# Patient Record
Sex: Female | Born: 1945 | Race: Black or African American | Hispanic: No | Smoking: Never smoker
Health system: Southern US, Community
[De-identification: ages and names within clinical notes are randomized; demographics above are authoritative.]

## PROBLEM LIST (undated history)

## (undated) DIAGNOSIS — I10 Essential (primary) hypertension: Secondary | ICD-10-CM

## (undated) DIAGNOSIS — I739 Peripheral vascular disease, unspecified: Secondary | ICD-10-CM

## (undated) DIAGNOSIS — E119 Type 2 diabetes mellitus without complications: Secondary | ICD-10-CM

## (undated) DIAGNOSIS — E785 Hyperlipidemia, unspecified: Secondary | ICD-10-CM

## (undated) HISTORY — DX: Type 2 diabetes mellitus without complications: E11.9

## (undated) HISTORY — DX: Hyperlipidemia, unspecified: E78.5

## (undated) HISTORY — DX: Peripheral vascular disease, unspecified: I73.9

## (undated) HISTORY — DX: Essential (primary) hypertension: I10

---

## 1999-10-19 ENCOUNTER — Emergency Department (HOSPITAL_COMMUNITY): Admission: EM | Admit: 1999-10-19 | Discharge: 1999-10-19 | Payer: Self-pay | Admitting: Emergency Medicine

## 2003-03-22 ENCOUNTER — Emergency Department (HOSPITAL_COMMUNITY): Admission: EM | Admit: 2003-03-22 | Discharge: 2003-03-22 | Payer: Self-pay | Admitting: Emergency Medicine

## 2003-07-09 ENCOUNTER — Encounter: Admission: RE | Admit: 2003-07-09 | Discharge: 2003-07-09 | Payer: Self-pay | Admitting: Family Medicine

## 2003-08-19 ENCOUNTER — Other Ambulatory Visit: Admission: RE | Admit: 2003-08-19 | Discharge: 2003-08-19 | Payer: Self-pay | Admitting: Family Medicine

## 2010-08-13 HISTORY — PX: OTHER SURGICAL HISTORY: SHX169

## 2010-11-02 ENCOUNTER — Inpatient Hospital Stay (HOSPITAL_COMMUNITY)
Admission: EM | Admit: 2010-11-02 | Discharge: 2010-11-08 | DRG: 418 | Disposition: A | Discharge status: Home / Self Care | Payer: Medicare Other | Attending: General Surgery | Admitting: General Surgery

## 2010-11-02 DIAGNOSIS — K8064 Calculus of gallbladder and bile duct with chronic cholecystitis without obstruction: Principal | ICD-10-CM | POA: Diagnosis present

## 2010-11-02 DIAGNOSIS — K8060 Calculus of gallbladder and bile duct with cholecystitis, unspecified, without obstruction: Principal | ICD-10-CM | POA: Diagnosis present

## 2010-11-02 DIAGNOSIS — I10 Essential (primary) hypertension: Secondary | ICD-10-CM | POA: Diagnosis present

## 2010-11-02 DIAGNOSIS — N390 Urinary tract infection, site not specified: Secondary | ICD-10-CM | POA: Diagnosis present

## 2010-11-02 LAB — CBC
HCT: 36.7 % (ref 36.0–46.0)
Hemoglobin: 12.6 g/dL (ref 12.0–15.0)
MCH: 32.9 pg (ref 26.0–34.0)
MCHC: 34.3 g/dL (ref 30.0–36.0)
MCV: 95.8 fL (ref 78.0–100.0)
Platelets: 169 10*3/uL (ref 150–400)
RBC: 3.83 MIL/uL — ABNORMAL LOW (ref 3.87–5.11)
RDW: 12.4 % (ref 11.5–15.5)
WBC: 4 10*3/uL (ref 4.0–10.5)

## 2010-11-02 LAB — DIFFERENTIAL
Basophils Absolute: 0 10*3/uL (ref 0.0–0.1)
Basophils Relative: 0 % (ref 0–1)
Eosinophils Absolute: 0 10*3/uL (ref 0.0–0.7)
Eosinophils Relative: 1 % (ref 0–5)
Lymphocytes Relative: 25 % (ref 12–46)
Lymphs Abs: 1 10*3/uL (ref 0.7–4.0)
Monocytes Absolute: 0.4 10*3/uL (ref 0.1–1.0)
Monocytes Relative: 10 % (ref 3–12)
Neutro Abs: 2.6 10*3/uL (ref 1.7–7.7)
Neutrophils Relative %: 64 % (ref 43–77)

## 2010-11-03 ENCOUNTER — Emergency Department (HOSPITAL_COMMUNITY): Payer: Medicare Other

## 2010-11-03 ENCOUNTER — Other Ambulatory Visit: Payer: Self-pay | Admitting: General Surgery

## 2010-11-03 ENCOUNTER — Encounter (HOSPITAL_COMMUNITY): Payer: Self-pay

## 2010-11-03 LAB — URINALYSIS, ROUTINE W REFLEX MICROSCOPIC
Bilirubin Urine: NEGATIVE
Glucose, UA: NEGATIVE mg/dL
Nitrite: NEGATIVE
Protein, ur: NEGATIVE mg/dL
Specific Gravity, Urine: 1.028 (ref 1.005–1.030)
Urobilinogen, UA: 1 mg/dL (ref 0.0–1.0)
pH: 5.5 (ref 5.0–8.0)

## 2010-11-03 LAB — COMPREHENSIVE METABOLIC PANEL
ALT: 39 U/L — ABNORMAL HIGH (ref 0–35)
AST: 34 U/L (ref 0–37)
Albumin: 3.6 g/dL (ref 3.5–5.2)
Alkaline Phosphatase: 70 U/L (ref 39–117)
BUN: 8 mg/dL (ref 6–23)
CO2: 23 mEq/L (ref 19–32)
Calcium: 9.2 mg/dL (ref 8.4–10.5)
Chloride: 105 mEq/L (ref 96–112)
Creatinine, Ser: 0.73 mg/dL (ref 0.4–1.2)
GFR calc Af Amer: >60 mL/min (ref >60)
GFR calc non Af Amer: >60 mL/min (ref >60)
Glucose, Bld: 142 mg/dL — ABNORMAL HIGH (ref 70–99)
Potassium: 3.5 mEq/L (ref 3.5–5.1)
Sodium: 136 mEq/L (ref 135–145)
Total Bilirubin: 0.3 mg/dL (ref 0.3–1.2)
Total Protein: 7.7 g/dL (ref 6.0–8.3)

## 2010-11-03 LAB — URINE MICROSCOPIC-ADD ON

## 2010-11-03 LAB — PROTIME-INR
INR: 1.09 (ref 0.00–1.49)
Prothrombin Time: 14.3 seconds (ref 11.6–15.2)

## 2010-11-03 LAB — APTT: aPTT: 30 seconds (ref 24–37)

## 2010-11-03 LAB — LIPASE, BLOOD: Lipase: 33 U/L (ref 11–59)

## 2010-11-03 MED ORDER — IOHEXOL 300 MG/ML  SOLN
100.0000 mL | Freq: Once | INTRAMUSCULAR | Status: AC | PRN
  Administered 2010-11-03: 100 mL via INTRAVENOUS

## 2010-11-04 ENCOUNTER — Inpatient Hospital Stay (HOSPITAL_COMMUNITY): Payer: Medicare Other

## 2010-11-04 LAB — COMPREHENSIVE METABOLIC PANEL
ALT: 214 U/L — ABNORMAL HIGH (ref 0–35)
AST: 303 U/L — ABNORMAL HIGH (ref 0–37)
Albumin: 3.1 g/dL — ABNORMAL LOW (ref 3.5–5.2)
Alkaline Phosphatase: 79 U/L (ref 39–117)
BUN: 5 mg/dL — ABNORMAL LOW (ref 6–23)
CO2: 25 mEq/L (ref 19–32)
Calcium: 8.9 mg/dL (ref 8.4–10.5)
Chloride: 105 mEq/L (ref 96–112)
Creatinine, Ser: 0.68 mg/dL (ref 0.4–1.2)
GFR calc Af Amer: >60 mL/min (ref >60)
GFR calc non Af Amer: >60 mL/min (ref >60)
Glucose, Bld: 144 mg/dL — ABNORMAL HIGH (ref 70–99)
Potassium: 4.3 mEq/L (ref 3.5–5.1)
Sodium: 137 mEq/L (ref 135–145)
Total Bilirubin: 0.5 mg/dL (ref 0.3–1.2)
Total Protein: 6.8 g/dL (ref 6.0–8.3)

## 2010-11-04 LAB — URINE CULTURE
Colony Count: 15000
Culture  Setup Time: 201203230430

## 2010-11-05 LAB — COMPREHENSIVE METABOLIC PANEL
ALT: 167 U/L — ABNORMAL HIGH (ref 0–35)
AST: 109 U/L — ABNORMAL HIGH (ref 0–37)
Albumin: 3 g/dL — ABNORMAL LOW (ref 3.5–5.2)
Alkaline Phosphatase: 77 U/L (ref 39–117)
BUN: 3 mg/dL — ABNORMAL LOW (ref 6–23)
CO2: 25 mEq/L (ref 19–32)
Calcium: 8.4 mg/dL (ref 8.4–10.5)
Chloride: 107 mEq/L (ref 96–112)
Creatinine, Ser: 0.58 mg/dL (ref 0.4–1.2)
GFR calc Af Amer: >60 mL/min (ref >60)
GFR calc non Af Amer: >60 mL/min (ref >60)
Glucose, Bld: 115 mg/dL — ABNORMAL HIGH (ref 70–99)
Potassium: 3.9 mEq/L (ref 3.5–5.1)
Sodium: 136 mEq/L (ref 135–145)
Total Bilirubin: 0.6 mg/dL (ref 0.3–1.2)
Total Protein: 6.6 g/dL (ref 6.0–8.3)

## 2010-11-05 LAB — CBC
HCT: 34.2 % — ABNORMAL LOW (ref 36.0–46.0)
Hemoglobin: 11 g/dL — ABNORMAL LOW (ref 12.0–15.0)
MCH: 31.6 pg (ref 26.0–34.0)
MCHC: 32.2 g/dL (ref 30.0–36.0)
MCV: 98.3 fL (ref 78.0–100.0)
Platelets: 163 10*3/uL (ref 150–400)
RBC: 3.48 MIL/uL — ABNORMAL LOW (ref 3.87–5.11)
RDW: 12.9 % (ref 11.5–15.5)
WBC: 7.3 10*3/uL (ref 4.0–10.5)

## 2010-11-06 LAB — COMPREHENSIVE METABOLIC PANEL
ALT: 231 U/L — ABNORMAL HIGH (ref 0–35)
AST: 183 U/L — ABNORMAL HIGH (ref 0–37)
Albumin: 3 g/dL — ABNORMAL LOW (ref 3.5–5.2)
Alkaline Phosphatase: 106 U/L (ref 39–117)
BUN: 5 mg/dL — ABNORMAL LOW (ref 6–23)
CO2: 27 mEq/L (ref 19–32)
Calcium: 9.1 mg/dL (ref 8.4–10.5)
Chloride: 104 mEq/L (ref 96–112)
Creatinine, Ser: 0.66 mg/dL (ref 0.4–1.2)
GFR calc Af Amer: >60 mL/min (ref >60)
GFR calc non Af Amer: >60 mL/min (ref >60)
Glucose, Bld: 99 mg/dL (ref 70–99)
Potassium: 3.8 mEq/L (ref 3.5–5.1)
Sodium: 137 mEq/L (ref 135–145)
Total Bilirubin: 0.5 mg/dL (ref 0.3–1.2)
Total Protein: 6.5 g/dL (ref 6.0–8.3)

## 2010-11-06 LAB — TYPE AND SCREEN
ABO/RH(D): B POS
Antibody Screen: NEGATIVE

## 2010-11-06 LAB — ABO/RH: ABO/RH(D): B POS

## 2010-11-06 NOTE — Consult Note (Signed)
[name], [name]                ACCOUNT NO.:  [id number]  MEDICAL RECORD NO.:  [id number]           PATIENT TYPE:  I  LOCATION:  1525                         FACILITY:  [facility]  PHYSICIAN:  [name]., M.D.DATE OF BIRTH:  [birth year]  DATE OF CONSULTATION:  11/03/2010 DATE OF DISCHARGE:                                CONSULTATION   REFERRING PHYSICIAN:  Amber L. Freida Busman, MD  PRIMARY CARE PHYSICIAN:  None.  REASON FOR CONSULTATION:  Questionable abnormal intraoperative cholangiogram  HISTORY:  The patient is a 65 year old African American woman who came into the ER complaining of back pain.  She is quite healthy.  This has been going on for several days.  Abdominal ultrasound in the emergency room revealed a gallbladder packed with stones and normal common bile duct.  A CT scan showed some cardiomegaly and a small cyst.  Her labs are remarkable for a normal white count and completely normal liver function tests.  The patient went for a laparoscopic cholecystectomy today.  She had an intraoperative cholangiogram done with a gallbladder in place.  There was a question of a defect in the distal common bile duct.  The patient is postop and still having a little pain and discomfort but nothing major and sipping on clear liquids.  MEDICATIONS ON ADMISSION:  Over-the-counter medicines such as Tylenol. No other medications other than Goody's and  naproxen p.r.n.  ALLERGIES:  She is allergic to PENICILLIN.  PAST MEDICAL HISTORY:  She has had eye surgery and has had a previous history of hypertension, has not been on any therapy recent.  Only surgery has been the cataracts.  FAMILY HISTORY:  Mother is living, has dementia and hypertension and history of coronary artery disease.  Her father's health is unknown. She had a sister who died from heart failure.  SOCIAL HISTORY:  She is a heavy drinker on weekends.  She is a retired Advertising copywriter at [employer]  PHYSICAL  EXAMINATION:  VITAL SIGNS:  The patient is afebrile.  Vital signs unremarkable. GENERAL:  A talkative African American female, in no acute distress. EYES:  Sclerae nonicteric. LUNGS:  Clear. HEART:  Regular rate and rhythm without murmurs or gallops. ABDOMEN:  Distended and slightly tender in the upper abdomen with a few bowel sounds.  ASSESSMENT:  Abnormal intraoperative cholangiogram with a possibility of stone or debris in the distal bile duct.  The patient is asymptomatic. Her liver tests are normal.  There is no need for an urgent endoscopic retrograde cholangiopancreatography.  PLAN:  We will go ahead and follow her with you and follow her liver function tests.  She has recovered adequately from the surgery. We will likely do an MRCP or possibly an ERCP if her liver function tests bump up.  We have discussed this approach with the patient and her son.          ______________________________ [name]. Malon [name]., M.D.     Waldron Session  D:  11/03/2010  T:  11/04/2010  Job:  [id number]  cc:   Tammy Muckle, MD [address]   [address] [place]  [place] [id number]  Electronically Signed by [name] M.D. on 11/06/2010 07:16:02 AM

## 2010-11-07 ENCOUNTER — Inpatient Hospital Stay (HOSPITAL_COMMUNITY): Payer: Medicare Other

## 2010-11-08 LAB — COMPREHENSIVE METABOLIC PANEL
ALT: 158 U/L — ABNORMAL HIGH (ref 0–35)
AST: 81 U/L — ABNORMAL HIGH (ref 0–37)
Albumin: 3.1 g/dL — ABNORMAL LOW (ref 3.5–5.2)
Alkaline Phosphatase: 95 U/L (ref 39–117)
BUN: 7 mg/dL (ref 6–23)
CO2: 26 mEq/L (ref 19–32)
Calcium: 8.6 mg/dL (ref 8.4–10.5)
Chloride: 104 mEq/L (ref 96–112)
Creatinine, Ser: 0.74 mg/dL (ref 0.4–1.2)
GFR calc Af Amer: >60 mL/min (ref >60)
GFR calc non Af Amer: >60 mL/min (ref >60)
Glucose, Bld: 91 mg/dL (ref 70–99)
Potassium: 3.5 mEq/L (ref 3.5–5.1)
Sodium: 136 mEq/L (ref 135–145)
Total Bilirubin: 0.8 mg/dL (ref 0.3–1.2)
Total Protein: 6.5 g/dL (ref 6.0–8.3)

## 2010-11-08 LAB — CBC
HCT: 34.2 % — ABNORMAL LOW (ref 36.0–46.0)
Hemoglobin: 11 g/dL — ABNORMAL LOW (ref 12.0–15.0)
MCH: 31.3 pg (ref 26.0–34.0)
MCHC: 32.2 g/dL (ref 30.0–36.0)
MCV: 97.4 fL (ref 78.0–100.0)
Platelets: 178 10*3/uL (ref 150–400)
RBC: 3.51 MIL/uL — ABNORMAL LOW (ref 3.87–5.11)
RDW: 12.6 % (ref 11.5–15.5)
WBC: 5 10*3/uL (ref 4.0–10.5)

## 2010-11-14 NOTE — Op Note (Signed)
  NAMECADYNCE, [name]                ACCOUNT NO.:  [id number]  MEDICAL RECORD NO.:  [id number]           PATIENT TYPE:  I  LOCATION:  1525                         FACILITY:  [facility]  PHYSICIAN:  Graylin Shiver, M.D.   DATE OF BIRTH:  September 18, 1945  DATE OF PROCEDURE:  11/04/2010 DATE OF DISCHARGE:                              OPERATIVE REPORT   PROCEDURE:  Attempted endoscopic retrograde cholangiopancreatography.  INDICATIONS FOR PROCEDURE:  The patient is a 65 year old female who underwent laparoscopic cholecystectomy yesterday with findings on intraoperative cholangiogram of probable of filling defects compatible with stones or sludge in the common bile duct.  No contrast passed into the duodenum.  Today, the patient's liver enzymes were higher than they had been and it was felt therefore that ERCP with sphincterotomy and stone extraction was indicated.  The patient was not having any abdominal pain and was not toxic in anyway.  The procedure was explained along with the potential risks of bleeding, infection, perforation, and pancreatitis.  DESCRIPTION OF PROCEDURE:  With the patient lying on her abdomen on the fluoroscopy table, she was given presedation with fentanyl 125 mcg IV, Versed 12 mg IV, and Benadryl 25 mg IV.  These were the total dosages given during the procedure.  The lateral viewing duodenum scope was inserted into the oropharynx and passed into the esophagus.  It was advanced down the esophagus and then into the stomach.  The stomach had a diffuse erythematous appearance to the mucosa compatible with gastritis.  The duodenum was entered and no abnormalities were seen. The papilla of Vater was located and it did appear small.  I attempted to cannulate the papilla multiple times using a guidewire and papillotome but was unable to successfully catheterize the common bile duct.  There was one time where the guidewire passed into the pancreatic duct but then I was unable  to get the guidewire into either duct again. The guidewire and catheter kept either bouncing off the papilla or would just not advance into either duct.  Multiple attempts were tried and finally I felt that I was not going to achieve cannulation.  The scope was withdrawn.  She tolerated the procedure well without obvious complications.  IMPRESSION:  Unsuccessful cannulation of the common bile duct.  PLAN:  I will give her a few days to allow the swelling to go down in the region of the papilla and then ask one of my partners to reattempt ERCP.          ______________________________ Graylin Shiver, M.D.    SFG/MEDQ  D:  11/04/2010  T:  11/05/2010  Job:  [id number]  cc:   [facility]  Electronically Signed by Herbert Moors MD on 11/14/2010 04:38:35 PM

## 2010-11-22 NOTE — Op Note (Signed)
[name], [name]                ACCOUNT NO.:  [id number]  MEDICAL RECORD NO.:  [id number]           PATIENT TYPE:  E  LOCATION:  WLED                         FACILITY:  [facility]  PHYSICIAN:  Lennie Muckle, MD      DATE OF BIRTH:  [birth year]  DATE OF PROCEDURE:  11/03/2010 DATE OF DISCHARGE:                              OPERATIVE REPORT   PREOPERATIVE DIAGNOSIS:  Acute cholecystitis.  POSTOPERATIVE DIAGNOSES:  Cholecystitis, choledocholithiasis.  PROCEDURE:  Laparoscopic cholecystectomy with cholangiogram.  SURGEON:  Jasan Doughtie L. Freida Busman, MD  ASSISTANT:  OR staff.  FINDINGS:  Acute findings of cholecystitis, multiple stones within the gallbladder, as well as multiple stones within the common bile duct.  SPECIMEN:  Gallbladder.  ESTIMATED BLOOD LOSS:  Minimal.  ANESTHESIA:  General.  INDICATION FOR PROCEDURE:  Ms. [name] is a 65 year old female who came in with abdominal and back pain, chronic vague abdominal pain.  Workup included a CBC, which was normal.  Serum chemistry showed small elevation of ALT, serum bilirubin was normal.  Imaging, there was a thickened gallbladder wall pericholecystic fluid.  She continued to have some discomfort in her back.  Due to the findings of pericholecystic fluid, I felt she has likely acute cholecystitis.  There was quite rise in her liver enzymes, therefore, we discusses the need for a cholangiogram.  Informed consent was obtained.  PROCEDURE IN DETAIL:  The patient was taken to the ER in the preoperative holding area, seen by Anesthesia, and given Cipro perioperatively.  She was taken to the operating room, placed in supine position.  After administration of general endotracheal anesthesia, sequential compression devices were applied to her lower extremities. Then, she was intubated.  There was noticed a mass on the posterior arytenoid.  We were able to get the endotracheal tube into the airway successfully.  Her abdomen was then prepped  and draped.  Surgical time- out was performed and again was placed an incision in the right upper quadrant.  Using a 5 mm camera, I placed the trocar into the abdominal cavity.  All layers of abdominal wall was visualized upon entry.  I inspected the abdomen and found no evidence of injury upon placement of the trocar after insufflation.  I then placed an 11 mm trocar at the umbilical region.  The patient was placed in the reverse Trendelenburg position.  Two additional trocars were placed in the right upper quadrant and visualization with  camera.  I grasped the fundus of the gallbladder, it was  somewhat distended and was irrigated and suctioned some of the fluid.  I was able to decompress the gallbladder enough to grasp the fundus of the gallbladder.  I then placed an additional port in the abdomen to lift up the left lobe of the liver due to its large size.  I then dissected down near the infundibulum.  There was some pericholecystic fluid and thickened fluid within the gallbladder wall. Using mostly blunt dissection as well as hook electrocautery, I obtained the  critical view of cystic duct and artery.  I clipped the cystic artery.  The duct was rather large in  size.  I dissected this down towards the common duct as much as I could close to the common bile duct.  I placed a clip proximally on the cystic duct and partially transected it.  I did feel several stones within the cystic duct.  I was able to milk these out of the cystic duct and I tried to get as close to common duct as I could to lift stones out.  I placed the cholangiogram catheter within the cystic duct, I secured this with a clip.  The cholangiogram was performed.  Flow went right and left hepatic duct in distally.  There was shadowing within the common duct.  I tried went into the duodenum; however, several stones were noted within the common bile duct.  I was rather close when she had a short cystic duct.  I  felt also this time short cystic duct at my dissection alone.  I placed 2 clips proximally on the cystic duct, transected and also placed an Endoloop around the cystic duct.  Then I continued dividing the gallbladder from the liver using electrocautery.  I removed the gallbladder in the EndoCatch bag.  I irrigated the abdomen with 2 L of saline.  There was a small amount of oozing from the liver bed, which I was able to control with electrocautery.  I placed a layer of __________ within the gallbladder fossa.  I did 1 final inspection of the liver bed, found no bleeding, no evidence of bile leakage.  I closed the fascial defect with 0 Vicryl, several sutures for full closure.  Her tissue was very thin, however, I was able to close the fascia successfully.  I contact Dr. Ramon Dredge for possible ERCP tomorrow.  We will repeat her serum chemistry tomorrow.     Lennie Muckle, MD     ALA/MEDQ  D:  11/03/2010  T:  11/04/2010  Job:  [id number]  Electronically Signed by Bertram Savin MD on 11/22/2010 12:06:36 PM

## 2010-11-22 NOTE — H&P (Signed)
Tammy, [name]                ACCOUNT NO.:  [id number]  MEDICAL RECORD NO.:  [id number]           PATIENT TYPE:  E  LOCATION:  WLED                         [facility]ACILITY:  [facility]  PHYSICIAN:  Lennie Muckle, MD      DATE OF BIRTH:  07-23-1946  DATE OF ADMISSION:  11/02/2010 DATE OF DISCHARGE:                             HISTORY & PHYSICAL   CHIEF COMPLAINT:  Back and flank pain going to the lower abdomen.  BRIEF HISTORY:  The patient is a 65 year old black female who has pain in her right and left sides.  She reports it hurts standing, lying down, and sitting, usually with movement.  This has been going on since 2006. She woke up yesterday and had pain, took her usual Tylenol and continued to do her daily chores.  Last night around 7:00 p.m. after supper, she developed nausea and vomiting 1 episode and describes a sharper pain than usual which lasted for about 5 hours before she came to the ER.  On arrival in the ER, her blood pressure was 165/105, her heart rate was 84, temperature was 97.8, and sats were 96% on room air.  She was treated with some morphine and has had no significant problems since. She underwent a CT of the abdomen which shows multiple gallstones, pericholecystic fluid, and gallbladder wall thickening slightly.  There is mild biliary ductal dilatation.  Common bile duct was normal.  There is a left renal cyst, otherwise the study was normal.  She also had an abdominal ultrasound which showed multiple stones.  Gallbladder wall was 0.4 cm with pericholecystic fluid and a questionable Murphy sign.  We were asked to see the patient in consultation after this.  The patient denies any problems with bowel movements.  Her last p.o. intake was last p.m. prior to coming to the ER.  PAST MEDICAL HISTORY:  Shows a history of high blood pressure but she has been on no medicines for at least 5 years.  PAST SURGICAL HISTORY:  She has had bilateral cataracts, otherwise  none.  FAMILY HISTORY:  Her mother is living.  She has dementia, high blood pressure, peptic ulcer disease, history of MI.  Her mother takes a good deal care, and she is frequently moving her mother, lifting and carrying her.  Father, unknown.  Brothers, none.  One sister who is deceased with "a hole in her heart."  SOCIAL HISTORY:  Tobacco none.  Alcohol, she is a fairly heavy drinker on the weekends and says she goes through a half a gallon but she is also drinking with friends.  Drugs, none.  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  REVIEW OF SYSTEMS:  CONSTITUTIONAL:  Fever, none.  SKIN:  No changes in weight.  She is unsure but she thinks she may have gained some weight. PSYCH:  No significant problems.  CV:  No headaches, dizziness, or syncope.  No history of stroke or seizure.  PULMONARY:  No orthopnea, no PND, no dyspnea on exertion.  No coughing or wheezing.  No asthma or recent upper respiratory tract infection.  CARDIAC:  No history  of chest pain on cardiac evaluation.  GI:  Negative for GERD.  She only had 1episode of nausea and vomiting.  No diarrhea, constipation, or blood in her stool.  GU:  No trouble voiding.  No odor or discomfort she is aware of.  LOWER EXTREMITIES:  No edema.  No claudication.  MUSCULOSKELETAL: No joint pain, just generalized aches on her side.  ENDOCRINE:  No thyroid disease or diabetes.  MEDICATIONS: 1. Tylenol p.r.n. 2. Naproxen p.r.n.  ALLERGIES:  PENICILLIN.  PHYSICAL EXAMINATION:  GENERAL:  This is a well-nourished, well- developed African American female in no acute distress. VITAL SIGNS:  Her last blood pressure was 164/98, heart rate was 68, temperature was 98, respiratory rate is 16, and sats are 100% on room air. HEAD:  Normocephalic. EYES, EARS, NOSE, AND THROAT:  All within normal limits. NECK:  No bruits.  No JVD.  No thyromegaly.  Trachea is in the midline. PULMONARY:  Normal respiratory effort. LUNGS:  Clear to  auscultation. CHEST:  Nontender to palpation. CARDIAC:  Normal S1-S2.  No murmur or rub was heard. ABDOMEN:  Bowel sounds are present.  Abdomen is not really distended. She is slightly tender on both flanks.  I cannot tell an appreciable difference between the right and the left.  There is no hernia, masses, or abscesses noted. RECTAL AND GENITALIA:  Deferred. LYMPHADENOPATHY:  None palpated in the cervical, axillary, or femoral areas. MUSCULOSKELETAL:  Within normal limits. SKIN:  No changes. NEUROLOGIC:  Cranial nerves are intact.  No focal changes. PSYCH:  Normal affect.  LABORATORY DATA:  Lipase 33.  Sodium is 136, potassium is 3.5, chloride is 105, CO2 is 23, BUN is 8, creatinine is 0.73, glucose 172, total bilirubin 0.3, alk phos 70, SGOT is 34, and SGPT is 39.  UA shows 11-20 white cells per high-powered field and lot of bacteria.  White count is 4000, hemoglobin is 12.6, hematocrit 36.7, and platelets 169,000.  IMPRESSION: 1. Chronic versus acute cholecystitis and cholelithiasis. 2. Probable urinary tract infection. 3. Hypertension. 4. Alcohol use.  PLAN:  We will keep her n.p.o. until we plan for laparoscopic cholecystectomy.  This was discussed with the patient and her family in detail and she is in agreement.  We will start her on antibiotics and begin treatment for high blood pressure.  She has no primary care physician and has been given a list by the ER of people to follow up with.     Eber Hong, P.A.   ______________________________ Lennie Muckle, MD    WDJ/MEDQ  D:  11/03/2010  T:  11/03/2010  Job:  [id number]  Electronically Signed by Sherrie George P.A. on 11/11/2010 11:17:06 AM Electronically Signed by Bertram Savin MD on 11/22/2010 12:05:59 PM

## 2010-11-22 NOTE — Discharge Summary (Signed)
NAMESAMIYAH, STUPKA                ACCOUNT NO.:  [id number]  MEDICAL RECORD NO.:  [id number]           PATIENT TYPE:  I  LOCATION:  1525                         FACILITY:  [facility]  PHYSICIAN:  Lennie Muckle, MD      DATE OF BIRTH:  08-Sep-1945  DATE OF ADMISSION:  11/02/2010 DATE OF DISCHARGE:  11/08/2010                              DISCHARGE SUMMARY   ADMISSION DIAGNOSES: 1. Cholelithiasis, cholecystitis with abdominal pain. 2. Questionable urinary tract infection. 3. Hypertension. 4. History of alcohol use.  DISCHARGE DIAGNOSES: 1. Cholecystitis, cholelithiasis. 2. Questionable urinary tract infection showed only 15,000 colonies. 3. Hypertension. 4. Alcohol use.  PROCEDURES: 1. Laparoscopic cholecystectomy, cholangiogram November 03, 2010, Dr.     Freida Busman. 2. Attempted endoscopic retrograde cholangiopancreatography November 04, 2010, Dr. Evette Cristal. 3. Endoscopic retrograde cholangiopancreatography, sphincterotomy,     stone extraction November 07, 2010, Dr. Ewing Schlein.  BRIEF HISTORY:  The patient is a 65 year old female who has had pain in her right and left side.  She reports it hurts standing, lying down, usually sitting and with movement.  It has been going on since 2006. She woke up the day prior to admission and had pain.  Took Tylenol and continued to do her daily chores.  Last night at around 7:00 p.m. she felt nausea and vomiting.  Pain was sharper and lasted for about 5 hours.  She was seen in the ER where she was extremely hypertensive. Blood pressure 165/105.  Her heart rate was 84.  Temperature was 97. She was treated with morphine and had no significant problems since.  CT of the abdomen shows multiple gallstones, pericholecystic fluid and gallbladder wall thickening.  There is also mild biliary ductal dilatation.  The common bile duct was normal.  There was a left renal cyst.  Otherwise normal.  Gallbladder wall was 0.4 cm on ultrasound again with pericholecystic fluid  and a questionable of Murphy's sign. She was seen in the ER and subsequently admitted by Dr. Freida Busman.  PAST MEDICAL HISTORY:  Shows a history of high blood pressure.  She has been on no medicines for at least 5 years.  PAST SURGICAL HISTORY:  Bilateral cataracts.  FAMILY HISTORY:  Her mother is living, has dementia and the patient takes care of her mother a good deal of the time, which includes frequently lifting and carrying her.  SOCIAL HISTORY:  Tobacco none.  Alcohol:  Fairly heavy drinker on weekends and says she goes through a half gallon but also drinks with friends.  Drugs none.  MEDICATIONS ON ADMISSION:  Tylenol and Naproxen.  ALLERGIES:  PENICILLIN.  HOSPITAL COURSE:  The patient was admitted.  She was placed on antibiotics and taken to the OR the next day at which time she underwent laparoscopic cholecystectomy with cholangiogram.  There were multiple stones within the gallbladder as well as multiple stones within the common bile duct.  She tolerated the procedure well and returned to the floor in satisfactory condition.  She was seen after that by Dr. Carman Ching and set up for ERCP the next day by Dr. Evette Cristal.  Unfortunately,  he was not able to cannulate the common bile duct.  The patient was sent back to the floor.  She was rested.  She was stable over the weekend and then taken back to the endoscopy suite where she underwent ERCP, sphincterotomy and stone extraction successfully on November 07, 2010, by Dr. Ewing Schlein.  Currently the patient is doing well.  She is afebrile.  Her blood pressure is still elevated.  She was empirically placed on Norvasc in the hospital.  Despite that, her blood pressure is still somewhat elevated, 127/86 and 134/90 are the last two blood pressures. Laboratories shows a white count of 5000, hemoglobin is 11, hematocrit is 34, platelets 178,000.  Sodium is 136, potassium is 3.5, chloride is 104, CO2 is 26, BUN is 7, creatinine 0.74, glucose is  91.  Her LFTs today shows a total bilirubin 0.8, alkaline phosphatase of 95, SGOT of 81, SGPT of 158.  On March 26 total bilirubin 0.5, alkaline phosphatase 106, SGOT 183, SGPT 231.  On March 24 total bilirubin and alkaline phosphatase were normal.  SGOT was 303.  SGPT was 214.  She is tolerating a regular diet without any difficulty.  Her wounds looked good and at this point we anticipate discharge today.  FOLLOWUP:  We have her set up to come back to the [facility] on November 28, 2010, at 2:00 p.m.  We are going to have her see Dr. Parke Simmers in less than 1 month for followup for her blood pressure.  We will await GI's followup to see when they want to see her back.  DISCHARGE ACTIVITY:  Light to moderate as instructed.  No lifting over 10 pounds.  No driving.  No strenuous activity.  DISCHARGE MEDICATIONS:  I am going to put her on: 1. Lisinopril 5 mg daily for one month with no refills.  Follow up per     Dr. Parke Simmers. 2. Multivitamin one daily. 3. Percocet 5/325 one to two q.4 p.r.n. 4. She can take Aleve with preadmission medicines one q.8 p.r.n. 5. She was also on Goody's Powders and Tylenol, which she may take on     a p.r.n. basis.  CONDITION ON DISCHARGE:  Improved.     Eber Hong, P.A.   ______________________________ Lennie Muckle, MD    WDJ/MEDQ  D:  11/08/2010  T:  11/08/2010  Job:  [id number]  cc:   Eber Hong, P.A.  Renaye Rakers, M.D. Fax: [phone]  Llana Aliment. Malon Kindle., M.D. Fax: [phone]  Electronically Signed by Sherrie George P.A. on 11/11/2010 11:19:04 AM Electronically Signed by Bertram Savin MD on 11/22/2010 12:05:56 PM

## 2010-11-27 NOTE — Op Note (Signed)
[name], [name]                ACCOUNT NO.:  [id number]  MEDICAL RECORD NO.:  [id number]           PATIENT TYPE:  I  LOCATION:  1525                         FACILITY:  [facility]  PHYSICIAN:  Petra Kuba, M.D.    DATE OF BIRTH:  1945/12/19  DATE OF PROCEDURE:  11/07/2010 DATE OF DISCHARGE:                              OPERATIVE REPORT   PROCEDURE:  ERCP, sphincterotomy, and stone extraction.  INDICATIONS:  The patient with a positive intraoperative cholangiogram, worrisome for stones and no drainage into the duodenum as well as with increased liver tests.  Consent was signed after risks, benefits, methods, options thoroughly discussed by both my partner, Dr. Evette Cristal, and me prior to sedation.  MEDICINES USED:  General anesthesia per anesthesia team.  PROCEDURE:  Side-viewing therapeutic video duodenoscope was inserted by indirect vision into the stomach and advanced through normal antrum of pylorus into the duodenum and a normal-appearing ampulla was brought into view.  Unfortunately, using the smaller sphincterotome loaded with the smaller Jagwire, we were unable to cannulate.  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  There were no injections or wire advancements towards either ducts.  After a moderate effort, we elected to proceed with a needle knife sphincterotomy because she did have a fairly long intraduodenal segment and this was done in the customary fashion until we saw bile oozing out of the duct.  We tried to advance the wire through the needle knife, but were unsuccessful and switched back to the sphincterotome.  Unfortunately, we could not cannulate with the sphincterotome in this method until we increased the needle knife sphincterotomy 3 times.  The amount of bile that was oozing out of the duct increase with each needle knife cut and then we were able to cannulate with the wire in the sphincterotome. Once that was done, dye was  injected and the ducts were filled.  There was no obvious duct abnormality, although based on the previous sphincterotomy dye readily emptied from the distal part of the duct.  We went ahead and increase the sphincterotomy using the sphincterotome in the customary fashion and then proceeded with multiple balloon pull- throughs using the 12-15 adjustable balloon, inflated to roughly the 12- 13 mm marked.  One small stone was delivered, but no other stones, sludge, or debris.  We then proceeded with an occlusion cholangiogram and we were using the inject below the balloon, so there were no obvious residual stones seen and no obvious leak from the sphincterotomy.  The balloon pulled through multiple times fairly readily through the patent sphincterotomy site.  After one more balloon pull-through, we elected to stop the procedure at this juncture.  The drainage at this time was a little sluggish, but adequate.  We elected to stop the procedure.  The wire was removed.  Scope was removed.  The patient tolerated the procedure well.  There was no obvious immediate complications.  ENDOSCOPIC DIAGNOSES: 1. Normal mobile ampulla. 2. No PD injections throughout the procedure. 3. Difficult cannulation, requiring needle knife sphincterotomy x3 and     then cannulation with the  sphincterotome and the wire and then     increasing the sphincterotomy in the customary fashion. 4. Multiple 12-15 mm adjustable balloon pull-throughs with 1 small     stone being delivered. 5. Negative occlusion cholangiogram at the end of the procedure and     sluggish, but adequate biliary drainage.  PLAN:  We will observe for delayed complications, if none hopefully can advance her diet tomorrow and home soon.          ______________________________ Petra Kuba, M.D.     MEM/MEDQ  D:  11/07/2010  T:  11/08/2010  Job:  [id number]  cc:   Graylin Shiver, M.D. Fax: [phone]  Lorne Skeens. Hoxworth, M.D. [address] [place] [address]  Electronically Signed by Vida Rigger M.D. on 11/27/2010 12:46:30 PM

## 2011-01-18 ENCOUNTER — Encounter (INDEPENDENT_AMBULATORY_CARE_PROVIDER_SITE_OTHER): Payer: Self-pay | Admitting: General Surgery

## 2012-12-17 IMAGING — CT CT ABD-PELV W/ CM
2 of 5 series · 17 of 46 positions shown, 19 images · IV contrast (agent unspecified)
Comparison: None.

CLINICAL DATA: Abdominal and back pain.  Flank pain.

CT ABDOMEN AND PELVIS WITH CONTRAST
TECHNIQUE: Multidetector CT imaging of the abdomen and pelvis was
performed following the standard protocol during bolus
administration of intravenous contrast.
Contrast: 100 ml 6mnipaque-3LL.

[Series 2: rtn ap with st · axial · 0.68mm/px · z∈[+848,+1218]mm · 14 of 84 slices shown, 16 images]
[im 5/84  soft-tissue]
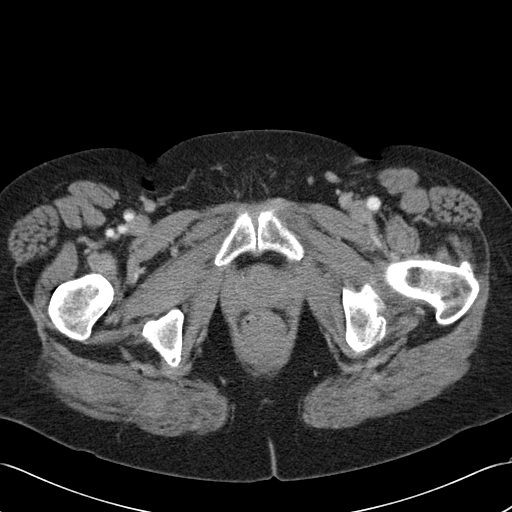
[im 5/84  bone]
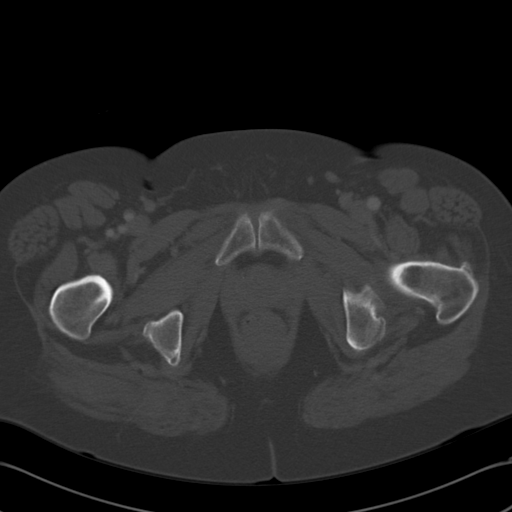
[im 13/84  soft-tissue]
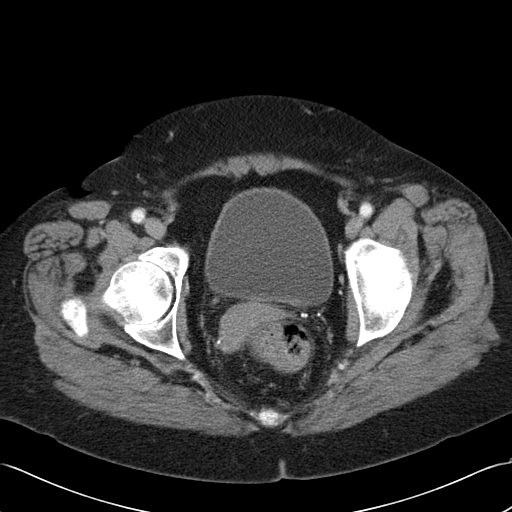
[im 17/84  soft-tissue]
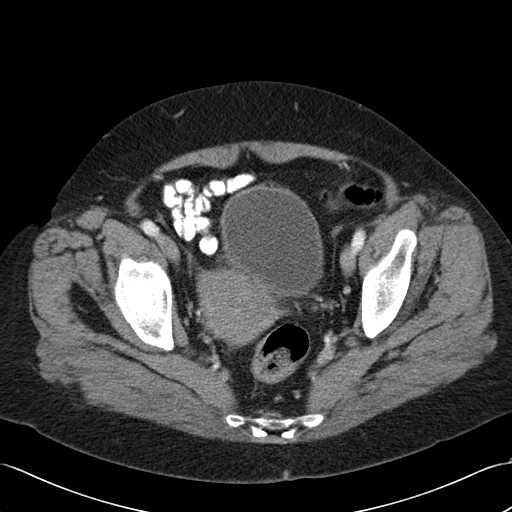
[im 21/84  soft-tissue]
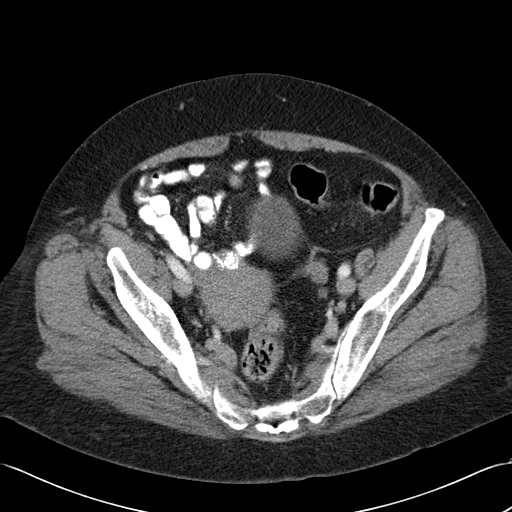
[im 30/84  soft-tissue]
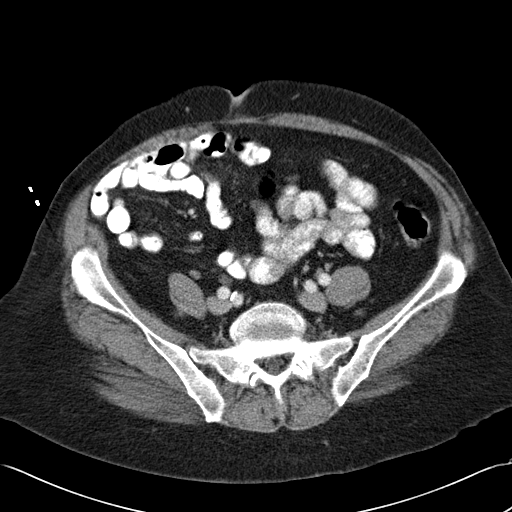
[im 34/84  soft-tissue]
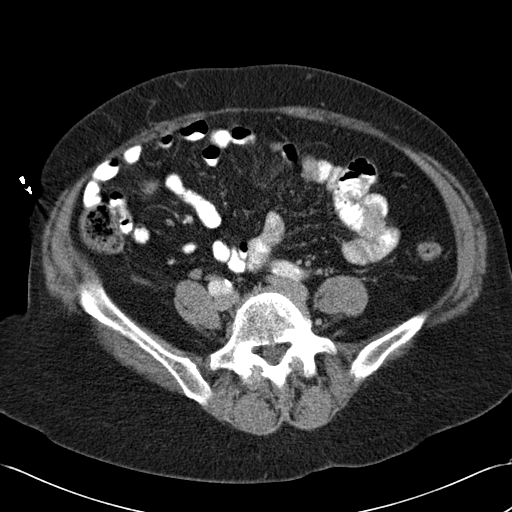
[im 38/84  soft-tissue]
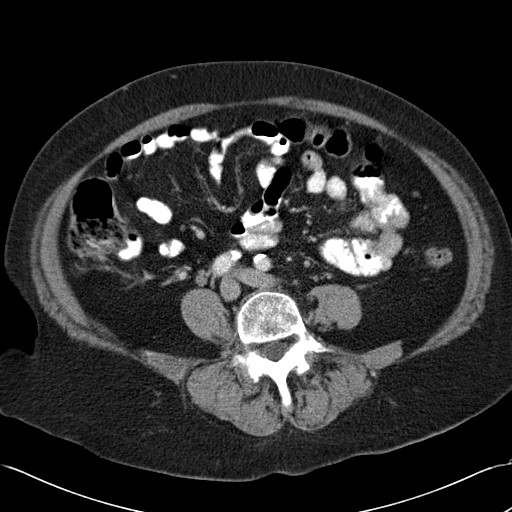
[im 46/84  soft-tissue]
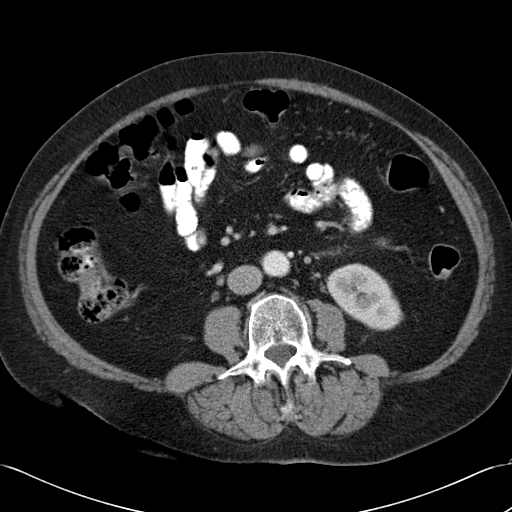
[im 50/84  soft-tissue]
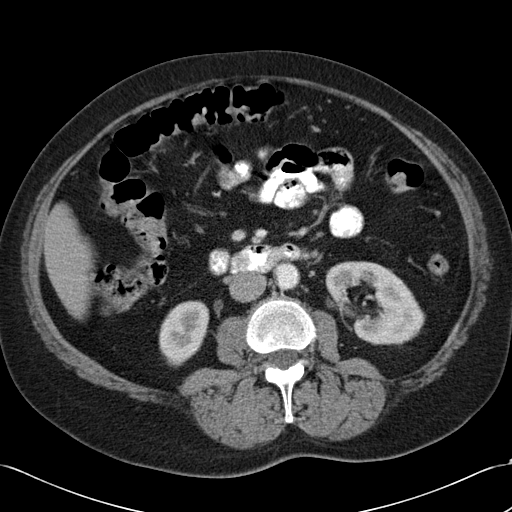
[im 50/84  bone]
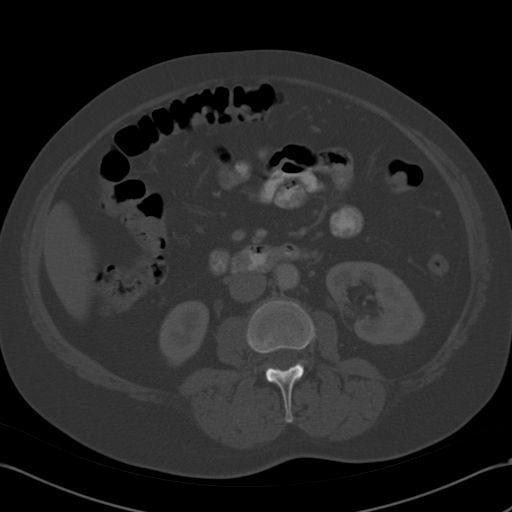
[im 54/84  soft-tissue]
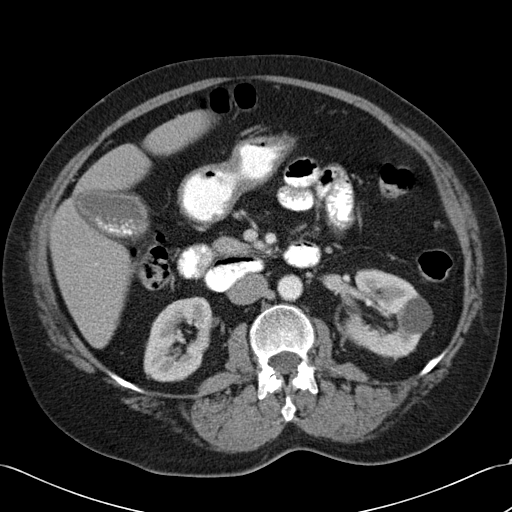
[im 63/84  soft-tissue]
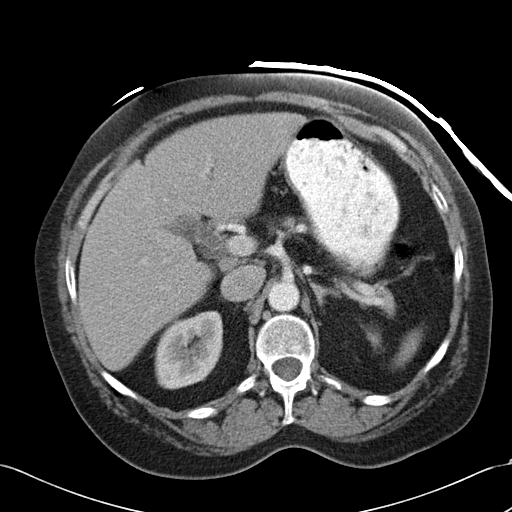
[im 67/84  soft-tissue]
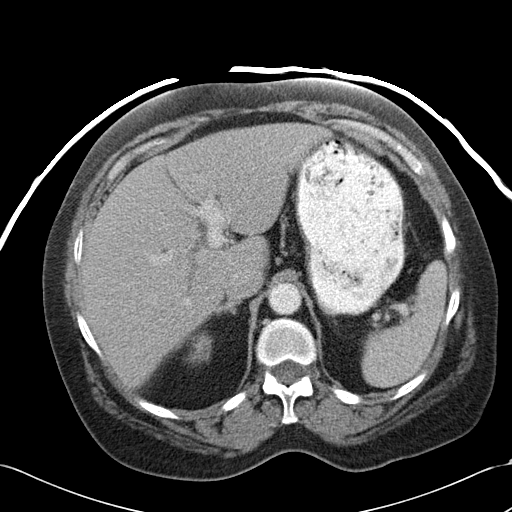
[im 71/84  soft-tissue]
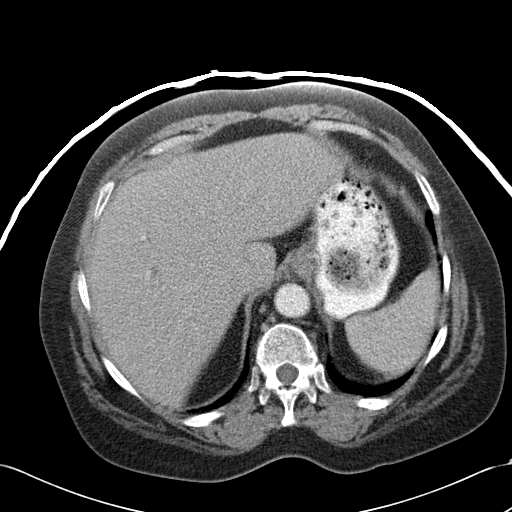
[im 79/84  soft-tissue]
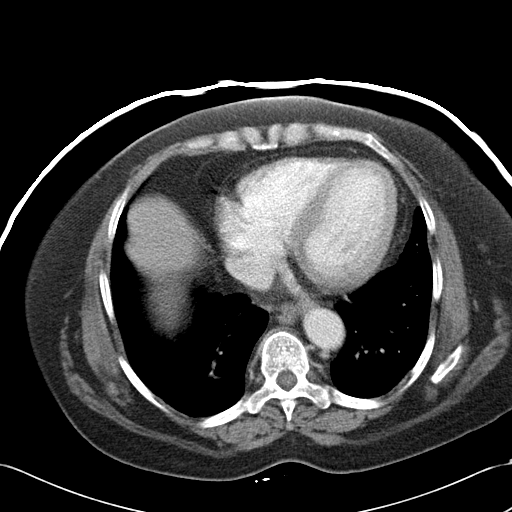

[Series 602: <mpr thick range> · coronal · 0.86mm/px · 3 of 84 slices shown]
[im 28/84  soft-tissue]
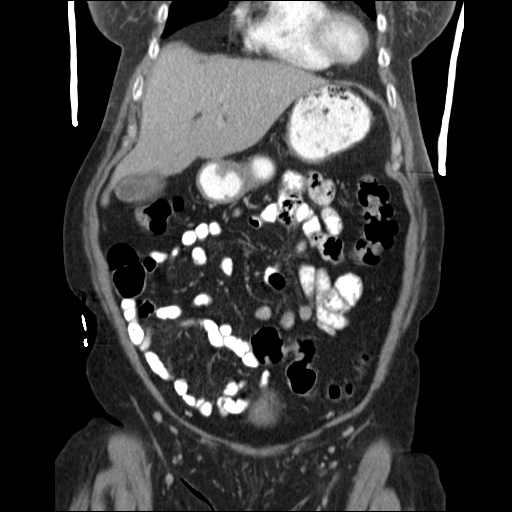
[im 37/84  soft-tissue]
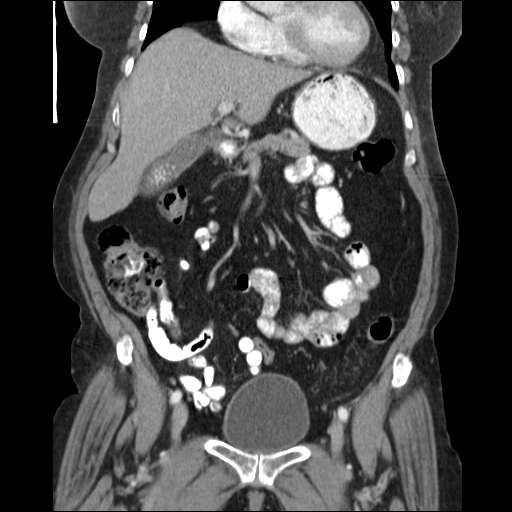
[im 47/84  soft-tissue]
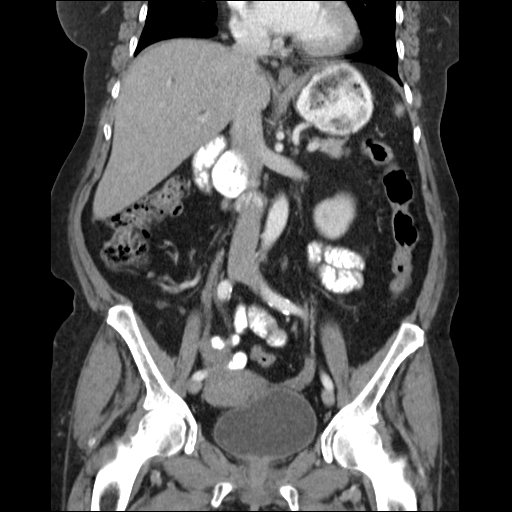

[17 of 46 positions shown; findings below may reference images not displayed]

FINDINGS: Lung bases are clear.  No pleural or pericardial
effusion.  Mild cardiomegaly.

Multiple small stones are present the gallbladder.  There is some
pericholecystic fluid and the gallbladder wall appears somewhat
thickened.  Mild intrahepatic biliary ductal dilatation is noted.
The common bile duct is unremarkable.  No focal liver lesion.  The
spleen, adrenal glands, pancreas and right kidney appear normal.
There is a left renal cyst.  Left kidney is otherwise normal in
appearance.

Uterus, adnexa and urinary bladder are unremarkable.  The stomach,
small and large bowel and appendix appear normal.  There is no
lymphadenopathy or fluid.  No focal bony abnormality.
IMPRESSION: 1.  Multiple small gallstones with some pericholecystic fluid
worrisome for acute cholecystitis.
2.  Small left renal cyst.
3.  Cardiomegaly.

## 2012-12-21 IMAGING — RF DG ERCP WO/W SPHINCTEROTOMY
3 series · 3 of 3 positions shown · non-contrast
Comparison: Intraoperative cholangiography 11/03/2010.

CLINICAL DATA: Retained stones and/or sludge in the common bile
duct at the time of laparoscopic cholecystectomy on 11/03/2010.

ERCP 11/07/2010:
TECHNIQUE: Multiple spot images were obtained with the
fluoroscopic device and submitted for interpretation post-
procedure.  ERCP was performed by Dr. Ku.

[Series 1: cont. · 1 of 1 slices shown (1 of 3)]
[im 1/1]
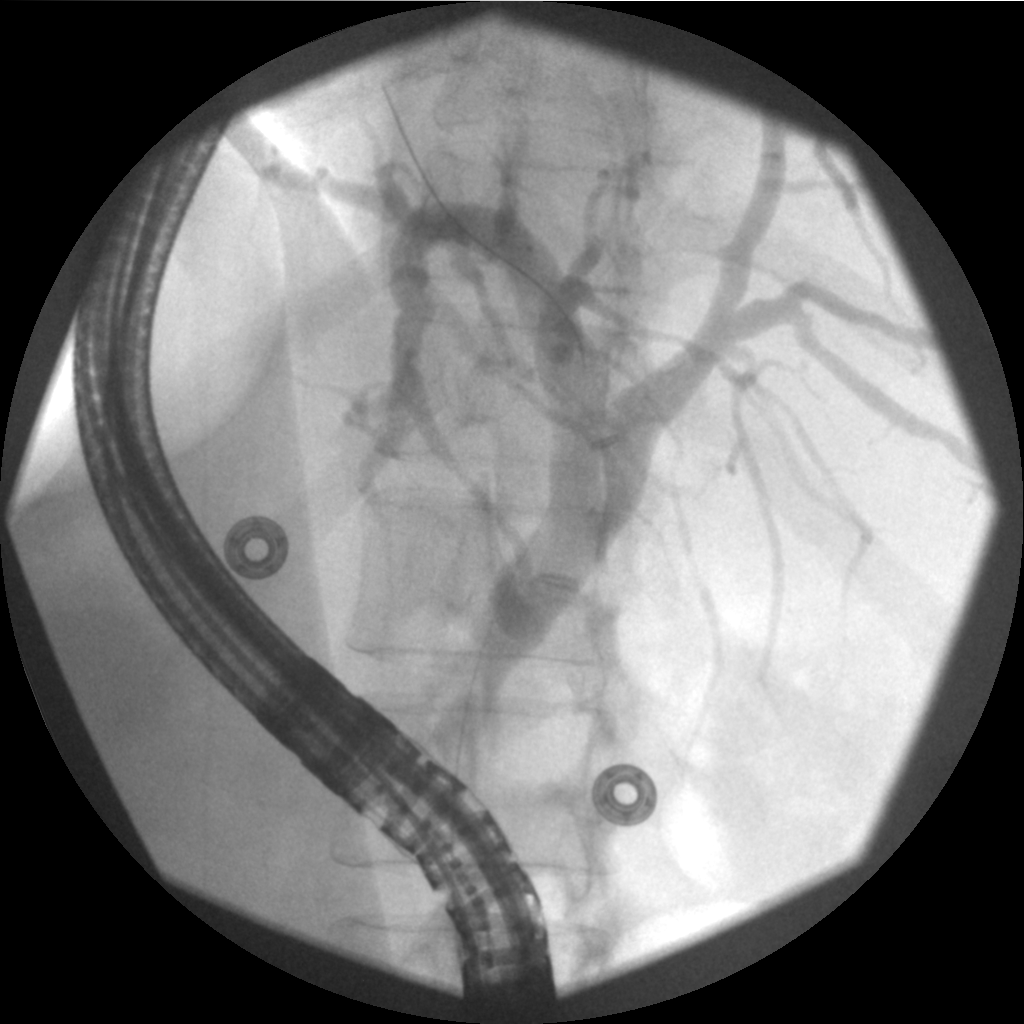

[Series 2: cont. · 1 of 1 slices shown (2 of 3)]
[im 1/1]
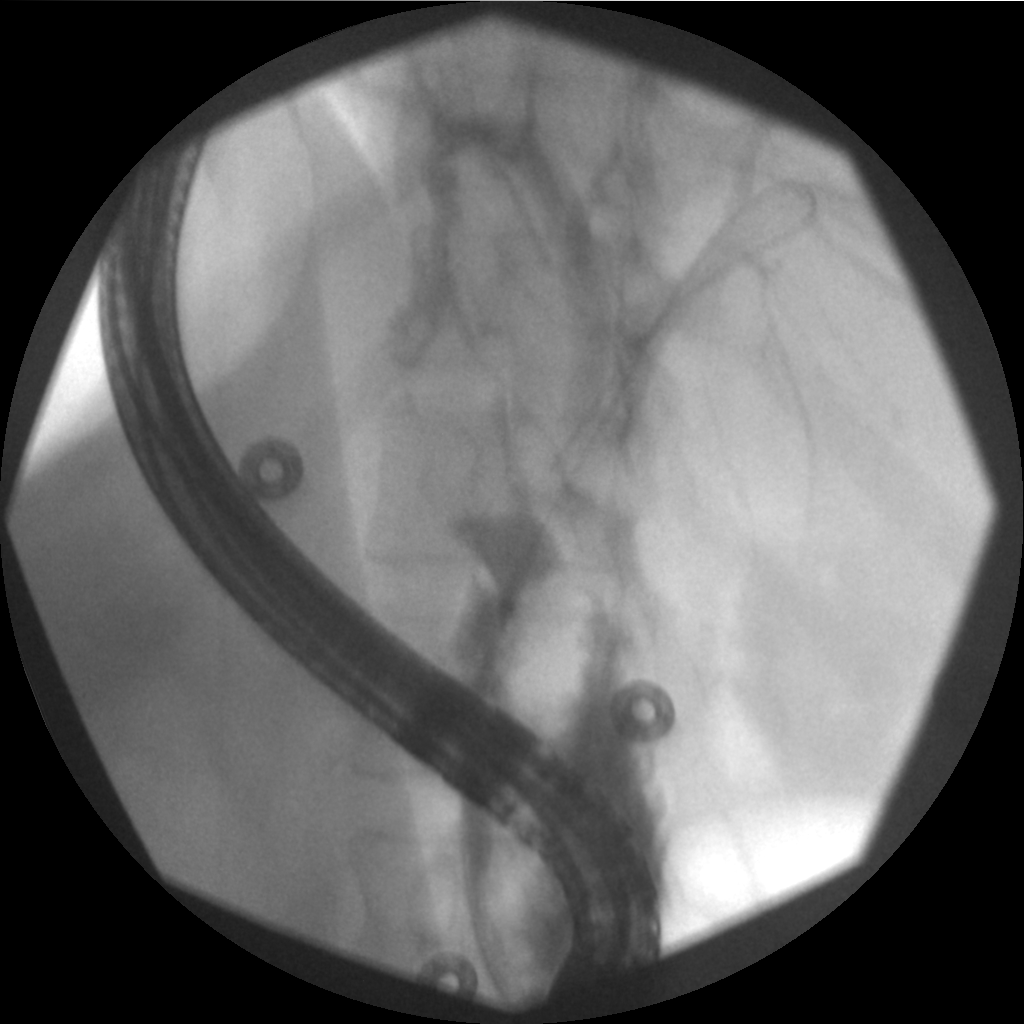

[Series 3: cont. · 1 of 1 slices shown (3 of 3)]
[im 1/1]
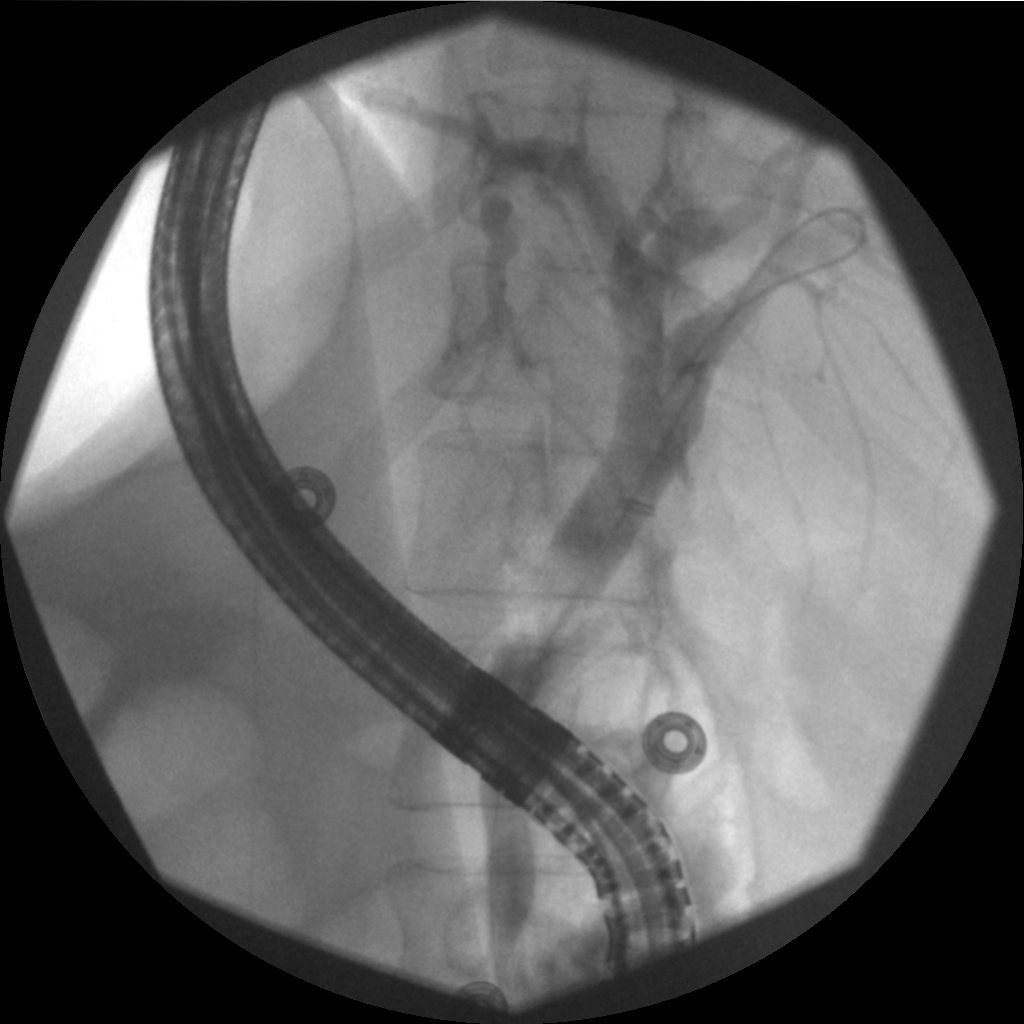

[3 of 3 positions shown; findings below may reference images not displayed]

FINDINGS: The three submitted images demonstrate mild intra and
extrahepatic biliary ductal dilation.  No filling defects are
identified within the bile duct on the images submitted.  Antegrade
flow of contrast into the duodenum was noted.  The radiologic
technologist documented 8.1 minutes of fluoroscopy time.
IMPRESSION: No residual filling defects after ERCP, sphincterotomy, and stone
removal by Dr. Ku.  Please correlate with findings at real time
fluoroscopy.

These images were submitted for radiologic interpretation only.
Please see the procedural report for the amount of contrast and the
fluoroscopy time utilized.

## 2015-01-07 DIAGNOSIS — I10 Essential (primary) hypertension: Secondary | ICD-10-CM | POA: Diagnosis not present

## 2015-01-07 DIAGNOSIS — E782 Mixed hyperlipidemia: Secondary | ICD-10-CM | POA: Diagnosis not present

## 2015-01-28 DIAGNOSIS — I10 Essential (primary) hypertension: Secondary | ICD-10-CM | POA: Diagnosis not present

## 2015-01-28 DIAGNOSIS — E782 Mixed hyperlipidemia: Secondary | ICD-10-CM | POA: Diagnosis not present

## 2015-02-04 DIAGNOSIS — M8580 Other specified disorders of bone density and structure, unspecified site: Secondary | ICD-10-CM | POA: Diagnosis not present

## 2015-02-04 DIAGNOSIS — Z1231 Encounter for screening mammogram for malignant neoplasm of breast: Secondary | ICD-10-CM | POA: Diagnosis not present

## 2015-02-18 DIAGNOSIS — I10 Essential (primary) hypertension: Secondary | ICD-10-CM | POA: Diagnosis not present

## 2015-02-21 DIAGNOSIS — N63 Unspecified lump in breast: Secondary | ICD-10-CM | POA: Diagnosis not present

## 2015-02-21 DIAGNOSIS — N6002 Solitary cyst of left breast: Secondary | ICD-10-CM | POA: Diagnosis not present

## 2015-03-23 DIAGNOSIS — E785 Hyperlipidemia, unspecified: Secondary | ICD-10-CM | POA: Diagnosis not present

## 2015-03-23 DIAGNOSIS — I10 Essential (primary) hypertension: Secondary | ICD-10-CM | POA: Diagnosis not present

## 2015-06-22 DIAGNOSIS — I10 Essential (primary) hypertension: Secondary | ICD-10-CM | POA: Diagnosis not present

## 2015-06-22 DIAGNOSIS — E782 Mixed hyperlipidemia: Secondary | ICD-10-CM | POA: Diagnosis not present

## 2015-06-22 DIAGNOSIS — Z6832 Body mass index (BMI) 32.0-32.9, adult: Secondary | ICD-10-CM | POA: Diagnosis not present

## 2015-10-21 DIAGNOSIS — I10 Essential (primary) hypertension: Secondary | ICD-10-CM | POA: Diagnosis not present

## 2015-10-21 DIAGNOSIS — E782 Mixed hyperlipidemia: Secondary | ICD-10-CM | POA: Diagnosis not present

## 2015-10-21 DIAGNOSIS — E876 Hypokalemia: Secondary | ICD-10-CM | POA: Diagnosis not present

## 2015-11-23 DIAGNOSIS — E785 Hyperlipidemia, unspecified: Secondary | ICD-10-CM | POA: Diagnosis not present

## 2015-11-23 DIAGNOSIS — E782 Mixed hyperlipidemia: Secondary | ICD-10-CM | POA: Diagnosis not present

## 2015-11-23 DIAGNOSIS — I10 Essential (primary) hypertension: Secondary | ICD-10-CM | POA: Diagnosis not present

## 2016-02-22 DIAGNOSIS — E782 Mixed hyperlipidemia: Secondary | ICD-10-CM | POA: Diagnosis not present

## 2016-02-22 DIAGNOSIS — I10 Essential (primary) hypertension: Secondary | ICD-10-CM | POA: Diagnosis not present

## 2016-06-22 DIAGNOSIS — I10 Essential (primary) hypertension: Secondary | ICD-10-CM | POA: Diagnosis not present

## 2016-06-22 DIAGNOSIS — E782 Mixed hyperlipidemia: Secondary | ICD-10-CM | POA: Diagnosis not present

## 2016-10-26 DIAGNOSIS — E785 Hyperlipidemia, unspecified: Secondary | ICD-10-CM | POA: Diagnosis not present

## 2016-10-26 DIAGNOSIS — I10 Essential (primary) hypertension: Secondary | ICD-10-CM | POA: Diagnosis not present

## 2016-10-31 DIAGNOSIS — Z1211 Encounter for screening for malignant neoplasm of colon: Secondary | ICD-10-CM | POA: Diagnosis not present

## 2016-10-31 DIAGNOSIS — Z1212 Encounter for screening for malignant neoplasm of rectum: Secondary | ICD-10-CM | POA: Diagnosis not present

## 2016-11-12 DIAGNOSIS — I10 Essential (primary) hypertension: Secondary | ICD-10-CM | POA: Diagnosis not present

## 2016-11-12 DIAGNOSIS — E876 Hypokalemia: Secondary | ICD-10-CM | POA: Diagnosis not present

## 2016-11-12 DIAGNOSIS — R195 Other fecal abnormalities: Secondary | ICD-10-CM | POA: Diagnosis not present

## 2016-11-20 DIAGNOSIS — D122 Benign neoplasm of ascending colon: Secondary | ICD-10-CM | POA: Diagnosis not present

## 2016-11-20 DIAGNOSIS — D128 Benign neoplasm of rectum: Secondary | ICD-10-CM | POA: Diagnosis not present

## 2016-11-20 DIAGNOSIS — C7A026 Malignant carcinoid tumor of the rectum: Secondary | ICD-10-CM | POA: Diagnosis not present

## 2016-11-20 DIAGNOSIS — D124 Benign neoplasm of descending colon: Secondary | ICD-10-CM | POA: Diagnosis not present

## 2016-11-20 DIAGNOSIS — K5730 Diverticulosis of large intestine without perforation or abscess without bleeding: Secondary | ICD-10-CM | POA: Diagnosis not present

## 2016-11-20 DIAGNOSIS — K635 Polyp of colon: Secondary | ICD-10-CM | POA: Diagnosis not present

## 2016-11-20 DIAGNOSIS — K621 Rectal polyp: Secondary | ICD-10-CM | POA: Diagnosis not present

## 2016-11-20 DIAGNOSIS — Z1211 Encounter for screening for malignant neoplasm of colon: Secondary | ICD-10-CM | POA: Diagnosis not present

## 2016-11-20 DIAGNOSIS — D123 Benign neoplasm of transverse colon: Secondary | ICD-10-CM | POA: Diagnosis not present

## 2016-12-10 DIAGNOSIS — H47323 Drusen of optic disc, bilateral: Secondary | ICD-10-CM | POA: Diagnosis not present

## 2016-12-10 DIAGNOSIS — Z961 Presence of intraocular lens: Secondary | ICD-10-CM | POA: Diagnosis not present

## 2017-02-07 DIAGNOSIS — E782 Mixed hyperlipidemia: Secondary | ICD-10-CM | POA: Diagnosis not present

## 2017-02-07 DIAGNOSIS — I10 Essential (primary) hypertension: Secondary | ICD-10-CM | POA: Diagnosis not present

## 2017-02-07 DIAGNOSIS — D649 Anemia, unspecified: Secondary | ICD-10-CM | POA: Diagnosis not present

## 2017-06-07 DIAGNOSIS — Z23 Encounter for immunization: Secondary | ICD-10-CM | POA: Diagnosis not present

## 2017-06-07 DIAGNOSIS — E785 Hyperlipidemia, unspecified: Secondary | ICD-10-CM | POA: Diagnosis not present

## 2017-06-07 DIAGNOSIS — I10 Essential (primary) hypertension: Secondary | ICD-10-CM | POA: Diagnosis not present

## 2017-06-07 DIAGNOSIS — D649 Anemia, unspecified: Secondary | ICD-10-CM | POA: Diagnosis not present

## 2017-07-12 DIAGNOSIS — E782 Mixed hyperlipidemia: Secondary | ICD-10-CM | POA: Diagnosis not present

## 2017-07-12 DIAGNOSIS — I10 Essential (primary) hypertension: Secondary | ICD-10-CM | POA: Diagnosis not present

## 2017-10-11 DIAGNOSIS — D510 Vitamin B12 deficiency anemia due to intrinsic factor deficiency: Secondary | ICD-10-CM | POA: Diagnosis not present

## 2017-10-11 DIAGNOSIS — D649 Anemia, unspecified: Secondary | ICD-10-CM | POA: Diagnosis not present

## 2017-10-11 DIAGNOSIS — E782 Mixed hyperlipidemia: Secondary | ICD-10-CM | POA: Diagnosis not present

## 2017-10-11 DIAGNOSIS — E785 Hyperlipidemia, unspecified: Secondary | ICD-10-CM | POA: Diagnosis not present

## 2017-10-11 DIAGNOSIS — D529 Folate deficiency anemia, unspecified: Secondary | ICD-10-CM | POA: Diagnosis not present

## 2017-10-11 DIAGNOSIS — I10 Essential (primary) hypertension: Secondary | ICD-10-CM | POA: Diagnosis not present

## 2017-10-11 DIAGNOSIS — E118 Type 2 diabetes mellitus with unspecified complications: Secondary | ICD-10-CM | POA: Diagnosis not present

## 2017-12-30 ENCOUNTER — Other Ambulatory Visit: Payer: Self-pay

## 2017-12-30 NOTE — Patient Outreach (Signed)
[facility] [facility]) Care Management  12/30/2017  JONETTA DAGLEY [birth year] [id number]   Medication Adherence call to Mrs. Jamina Macbeth patient is showing past due under [place]. spoke with patient she pick up both medication Rosuvastatin 10 mg and Losartan/HCTZ 100/12.5 from [facility] on 12/29/17 patient wont be due until June/2019.   [place] Management Direct Dial [phone]  Fax [phone] [email or web address]

## 2018-01-20 ENCOUNTER — Other Ambulatory Visit: Payer: Self-pay

## 2018-01-20 NOTE — Patient Outreach (Signed)
[facility]) Care Management  01/20/2018  [name] 06/22/1946 [id number]   Medication Adherence call to Mrs : [name] patient's telephone number is disconnected under [facility].We need a new telephone number for patient : [name] is due on Losartan/Hctz 100/12 :5 mg under [place].   [place] Dial [place] Management Fax [phone] [name]

## 2018-03-26 DIAGNOSIS — I10 Essential (primary) hypertension: Secondary | ICD-10-CM | POA: Diagnosis not present

## 2018-03-26 DIAGNOSIS — R739 Hyperglycemia, unspecified: Secondary | ICD-10-CM | POA: Diagnosis not present

## 2018-03-26 DIAGNOSIS — E782 Mixed hyperlipidemia: Secondary | ICD-10-CM | POA: Diagnosis not present

## 2018-05-26 DIAGNOSIS — E782 Mixed hyperlipidemia: Secondary | ICD-10-CM | POA: Diagnosis not present

## 2018-05-26 DIAGNOSIS — R739 Hyperglycemia, unspecified: Secondary | ICD-10-CM | POA: Diagnosis not present

## 2018-05-26 DIAGNOSIS — I10 Essential (primary) hypertension: Secondary | ICD-10-CM | POA: Diagnosis not present

## 2018-06-26 DIAGNOSIS — I10 Essential (primary) hypertension: Secondary | ICD-10-CM | POA: Diagnosis not present

## 2018-06-26 DIAGNOSIS — Z0000 Encounter for general adult medical examination without abnormal findings: Secondary | ICD-10-CM | POA: Diagnosis not present

## 2018-08-01 DIAGNOSIS — I10 Essential (primary) hypertension: Secondary | ICD-10-CM | POA: Diagnosis not present

## 2018-09-08 ENCOUNTER — Other Ambulatory Visit: Payer: Self-pay

## 2018-09-08 NOTE — Patient Outreach (Signed)
[facility]) Care Management  09/08/2018  [name] [birth year] [id number]   Medication Adherence call to [name] patient's telephone number is disconnected patient is due on Losartan /HCTZ 100/12.5 mg and Metfromin 500 mg. [facility] said patient last pick up was on 08/15/18 for a 30 days supply. [name] is showing past due under [place].   [place] Management Direct Dial [phone]  Fax [phone] [email or web address]

## 2018-10-31 DIAGNOSIS — I10 Essential (primary) hypertension: Secondary | ICD-10-CM | POA: Diagnosis not present

## 2018-12-19 DIAGNOSIS — I10 Essential (primary) hypertension: Secondary | ICD-10-CM | POA: Diagnosis not present

## 2018-12-19 DIAGNOSIS — R7309 Other abnormal glucose: Secondary | ICD-10-CM | POA: Diagnosis not present

## 2018-12-19 DIAGNOSIS — E785 Hyperlipidemia, unspecified: Secondary | ICD-10-CM | POA: Diagnosis not present

## 2019-01-22 ENCOUNTER — Other Ambulatory Visit: Payer: Self-pay

## 2019-01-22 NOTE — Patient Outreach (Signed)
[facility]) Care Management  01/22/2019  [name] June 30, 1946 [id number]   Medication Adherence call to [name] patients telephone number is disconnected patient is showing past due under [place].   [place] Management Direct Dial [phone]  Fax [phone] [name]

## 2019-03-06 ENCOUNTER — Other Ambulatory Visit: Payer: Self-pay

## 2019-03-06 NOTE — Patient Outreach (Signed)
[facility]) Care Management  03/06/2019  [name] [birth year] [id number]   Medication Adherence call to [name]  Patient has a disconnected number patient is showing past due under [place].   [place] Management Direct Dial [phone]  Fax [phone] [email or web address]

## 2019-03-20 ENCOUNTER — Other Ambulatory Visit: Payer: Self-pay

## 2019-03-20 DIAGNOSIS — E782 Mixed hyperlipidemia: Secondary | ICD-10-CM | POA: Diagnosis not present

## 2019-03-20 DIAGNOSIS — R739 Hyperglycemia, unspecified: Secondary | ICD-10-CM | POA: Diagnosis not present

## 2019-03-20 DIAGNOSIS — I10 Essential (primary) hypertension: Secondary | ICD-10-CM | POA: Diagnosis not present

## 2019-03-20 NOTE — Patient Outreach (Signed)
[facility]) Care Management  03/20/2019  [name] 12-Jun-1946 [id number]   Medication Adherence call to [name] patients telephone number is disconnected patient is past due under [facility] ins.   [place] Management Direct Dial [phone]  Fax [phone] [email or web address]

## 2019-05-13 DIAGNOSIS — E785 Hyperlipidemia, unspecified: Secondary | ICD-10-CM | POA: Diagnosis not present

## 2019-05-13 DIAGNOSIS — Z0000 Encounter for general adult medical examination without abnormal findings: Secondary | ICD-10-CM | POA: Diagnosis not present

## 2019-05-13 DIAGNOSIS — I10 Essential (primary) hypertension: Secondary | ICD-10-CM | POA: Diagnosis not present

## 2019-10-22 DIAGNOSIS — E785 Hyperlipidemia, unspecified: Secondary | ICD-10-CM | POA: Diagnosis not present

## 2019-10-22 DIAGNOSIS — I10 Essential (primary) hypertension: Secondary | ICD-10-CM | POA: Diagnosis not present

## 2019-10-22 DIAGNOSIS — R739 Hyperglycemia, unspecified: Secondary | ICD-10-CM | POA: Diagnosis not present

## 2019-10-22 DIAGNOSIS — B888 Other specified infestations: Secondary | ICD-10-CM | POA: Diagnosis not present

## 2020-01-01 DIAGNOSIS — E7849 Other hyperlipidemia: Secondary | ICD-10-CM | POA: Diagnosis not present

## 2020-01-01 DIAGNOSIS — E785 Hyperlipidemia, unspecified: Secondary | ICD-10-CM | POA: Diagnosis not present

## 2020-01-01 DIAGNOSIS — I10 Essential (primary) hypertension: Secondary | ICD-10-CM | POA: Diagnosis not present

## 2020-01-01 DIAGNOSIS — R7301 Impaired fasting glucose: Secondary | ICD-10-CM | POA: Diagnosis not present

## 2020-01-01 DIAGNOSIS — D649 Anemia, unspecified: Secondary | ICD-10-CM | POA: Diagnosis not present

## 2020-01-01 DIAGNOSIS — R7309 Other abnormal glucose: Secondary | ICD-10-CM | POA: Diagnosis not present

## 2020-03-11 DIAGNOSIS — I10 Essential (primary) hypertension: Secondary | ICD-10-CM | POA: Diagnosis not present

## 2020-03-11 DIAGNOSIS — E7849 Other hyperlipidemia: Secondary | ICD-10-CM | POA: Diagnosis not present

## 2020-04-12 DIAGNOSIS — I10 Essential (primary) hypertension: Secondary | ICD-10-CM | POA: Diagnosis not present

## 2020-04-12 DIAGNOSIS — E7849 Other hyperlipidemia: Secondary | ICD-10-CM | POA: Diagnosis not present

## 2020-05-12 DIAGNOSIS — E7849 Other hyperlipidemia: Secondary | ICD-10-CM | POA: Diagnosis not present

## 2020-05-12 DIAGNOSIS — I10 Essential (primary) hypertension: Secondary | ICD-10-CM | POA: Diagnosis not present

## 2020-07-12 DIAGNOSIS — E7849 Other hyperlipidemia: Secondary | ICD-10-CM | POA: Diagnosis not present

## 2020-07-12 DIAGNOSIS — I10 Essential (primary) hypertension: Secondary | ICD-10-CM | POA: Diagnosis not present

## 2020-09-10 DIAGNOSIS — I10 Essential (primary) hypertension: Secondary | ICD-10-CM | POA: Diagnosis not present

## 2020-09-10 DIAGNOSIS — E7849 Other hyperlipidemia: Secondary | ICD-10-CM | POA: Diagnosis not present

## 2020-09-13 DIAGNOSIS — E7849 Other hyperlipidemia: Secondary | ICD-10-CM | POA: Diagnosis not present

## 2020-09-13 DIAGNOSIS — G2581 Restless legs syndrome: Secondary | ICD-10-CM | POA: Diagnosis not present

## 2020-09-13 DIAGNOSIS — E1169 Type 2 diabetes mellitus with other specified complication: Secondary | ICD-10-CM | POA: Diagnosis not present

## 2020-09-13 DIAGNOSIS — Z0000 Encounter for general adult medical examination without abnormal findings: Secondary | ICD-10-CM | POA: Diagnosis not present

## 2020-09-13 DIAGNOSIS — R739 Hyperglycemia, unspecified: Secondary | ICD-10-CM | POA: Diagnosis not present

## 2020-09-13 DIAGNOSIS — I10 Essential (primary) hypertension: Secondary | ICD-10-CM | POA: Diagnosis not present

## 2020-11-10 DIAGNOSIS — E7849 Other hyperlipidemia: Secondary | ICD-10-CM | POA: Diagnosis not present

## 2020-11-10 DIAGNOSIS — I10 Essential (primary) hypertension: Secondary | ICD-10-CM | POA: Diagnosis not present

## 2021-01-12 DIAGNOSIS — R7309 Other abnormal glucose: Secondary | ICD-10-CM | POA: Diagnosis not present

## 2021-01-12 DIAGNOSIS — E7849 Other hyperlipidemia: Secondary | ICD-10-CM | POA: Diagnosis not present

## 2021-01-12 DIAGNOSIS — G2581 Restless legs syndrome: Secondary | ICD-10-CM | POA: Diagnosis not present

## 2021-01-12 DIAGNOSIS — I10 Essential (primary) hypertension: Secondary | ICD-10-CM | POA: Diagnosis not present

## 2021-03-12 DIAGNOSIS — E7849 Other hyperlipidemia: Secondary | ICD-10-CM | POA: Diagnosis not present

## 2021-03-12 DIAGNOSIS — I10 Essential (primary) hypertension: Secondary | ICD-10-CM | POA: Diagnosis not present

## 2021-05-12 DIAGNOSIS — E7849 Other hyperlipidemia: Secondary | ICD-10-CM | POA: Diagnosis not present

## 2021-05-12 DIAGNOSIS — I10 Essential (primary) hypertension: Secondary | ICD-10-CM | POA: Diagnosis not present

## 2021-05-15 DIAGNOSIS — R7301 Impaired fasting glucose: Secondary | ICD-10-CM | POA: Diagnosis not present

## 2021-05-15 DIAGNOSIS — I10 Essential (primary) hypertension: Secondary | ICD-10-CM | POA: Diagnosis not present

## 2021-05-15 DIAGNOSIS — E7849 Other hyperlipidemia: Secondary | ICD-10-CM | POA: Diagnosis not present

## 2021-05-15 DIAGNOSIS — R7309 Other abnormal glucose: Secondary | ICD-10-CM | POA: Diagnosis not present

## 2021-05-15 DIAGNOSIS — G2581 Restless legs syndrome: Secondary | ICD-10-CM | POA: Diagnosis not present

## 2021-06-09 DIAGNOSIS — R7309 Other abnormal glucose: Secondary | ICD-10-CM | POA: Diagnosis not present

## 2021-06-09 DIAGNOSIS — E7849 Other hyperlipidemia: Secondary | ICD-10-CM | POA: Diagnosis not present

## 2021-06-09 DIAGNOSIS — I10 Essential (primary) hypertension: Secondary | ICD-10-CM | POA: Diagnosis not present

## 2021-06-12 DIAGNOSIS — E7849 Other hyperlipidemia: Secondary | ICD-10-CM | POA: Diagnosis not present

## 2021-06-12 DIAGNOSIS — I10 Essential (primary) hypertension: Secondary | ICD-10-CM | POA: Diagnosis not present

## 2021-07-12 DIAGNOSIS — I10 Essential (primary) hypertension: Secondary | ICD-10-CM | POA: Diagnosis not present

## 2021-07-12 DIAGNOSIS — E7849 Other hyperlipidemia: Secondary | ICD-10-CM | POA: Diagnosis not present

## 2021-07-18 DIAGNOSIS — I10 Essential (primary) hypertension: Secondary | ICD-10-CM | POA: Diagnosis not present

## 2021-07-18 DIAGNOSIS — E1169 Type 2 diabetes mellitus with other specified complication: Secondary | ICD-10-CM | POA: Diagnosis not present

## 2021-07-18 DIAGNOSIS — E781 Pure hyperglyceridemia: Secondary | ICD-10-CM | POA: Diagnosis not present

## 2021-09-04 DIAGNOSIS — I10 Essential (primary) hypertension: Secondary | ICD-10-CM | POA: Diagnosis not present

## 2021-09-04 DIAGNOSIS — R739 Hyperglycemia, unspecified: Secondary | ICD-10-CM | POA: Diagnosis not present

## 2021-09-04 DIAGNOSIS — E1169 Type 2 diabetes mellitus with other specified complication: Secondary | ICD-10-CM | POA: Diagnosis not present

## 2021-09-04 DIAGNOSIS — E785 Hyperlipidemia, unspecified: Secondary | ICD-10-CM | POA: Diagnosis not present

## 2021-09-04 DIAGNOSIS — E782 Mixed hyperlipidemia: Secondary | ICD-10-CM | POA: Diagnosis not present

## 2021-09-10 DIAGNOSIS — E7849 Other hyperlipidemia: Secondary | ICD-10-CM | POA: Diagnosis not present

## 2021-09-10 DIAGNOSIS — I10 Essential (primary) hypertension: Secondary | ICD-10-CM | POA: Diagnosis not present

## 2021-11-10 DIAGNOSIS — I10 Essential (primary) hypertension: Secondary | ICD-10-CM | POA: Diagnosis not present

## 2021-11-10 DIAGNOSIS — E7849 Other hyperlipidemia: Secondary | ICD-10-CM | POA: Diagnosis not present

## 2021-12-01 DIAGNOSIS — E7849 Other hyperlipidemia: Secondary | ICD-10-CM | POA: Diagnosis not present

## 2021-12-01 DIAGNOSIS — I10 Essential (primary) hypertension: Secondary | ICD-10-CM | POA: Diagnosis not present

## 2022-01-02 DIAGNOSIS — E7849 Other hyperlipidemia: Secondary | ICD-10-CM | POA: Diagnosis not present

## 2022-01-02 DIAGNOSIS — E1169 Type 2 diabetes mellitus with other specified complication: Secondary | ICD-10-CM | POA: Diagnosis not present

## 2022-01-02 DIAGNOSIS — Z0000 Encounter for general adult medical examination without abnormal findings: Secondary | ICD-10-CM | POA: Diagnosis not present

## 2022-01-02 DIAGNOSIS — I10 Essential (primary) hypertension: Secondary | ICD-10-CM | POA: Diagnosis not present

## 2022-03-15 DIAGNOSIS — R7309 Other abnormal glucose: Secondary | ICD-10-CM | POA: Diagnosis not present

## 2022-03-15 DIAGNOSIS — E7849 Other hyperlipidemia: Secondary | ICD-10-CM | POA: Diagnosis not present

## 2022-03-15 DIAGNOSIS — E1169 Type 2 diabetes mellitus with other specified complication: Secondary | ICD-10-CM | POA: Diagnosis not present

## 2022-03-15 DIAGNOSIS — I10 Essential (primary) hypertension: Secondary | ICD-10-CM | POA: Diagnosis not present

## 2022-03-15 DIAGNOSIS — E782 Mixed hyperlipidemia: Secondary | ICD-10-CM | POA: Diagnosis not present

## 2022-06-14 DIAGNOSIS — E1169 Type 2 diabetes mellitus with other specified complication: Secondary | ICD-10-CM | POA: Diagnosis not present

## 2022-06-14 DIAGNOSIS — I10 Essential (primary) hypertension: Secondary | ICD-10-CM | POA: Diagnosis not present

## 2022-06-14 DIAGNOSIS — R600 Localized edema: Secondary | ICD-10-CM | POA: Diagnosis not present

## 2022-07-03 DIAGNOSIS — E7849 Other hyperlipidemia: Secondary | ICD-10-CM | POA: Diagnosis not present

## 2022-07-03 DIAGNOSIS — R7309 Other abnormal glucose: Secondary | ICD-10-CM | POA: Diagnosis not present

## 2022-07-03 DIAGNOSIS — E1169 Type 2 diabetes mellitus with other specified complication: Secondary | ICD-10-CM | POA: Diagnosis not present

## 2022-07-03 DIAGNOSIS — I10 Essential (primary) hypertension: Secondary | ICD-10-CM | POA: Diagnosis not present

## 2022-07-03 DIAGNOSIS — R600 Localized edema: Secondary | ICD-10-CM | POA: Diagnosis not present

## 2022-08-11 DIAGNOSIS — E7849 Other hyperlipidemia: Secondary | ICD-10-CM | POA: Diagnosis not present

## 2022-08-11 DIAGNOSIS — I10 Essential (primary) hypertension: Secondary | ICD-10-CM | POA: Diagnosis not present

## 2022-09-14 DIAGNOSIS — E782 Mixed hyperlipidemia: Secondary | ICD-10-CM | POA: Diagnosis not present

## 2022-09-14 DIAGNOSIS — E7849 Other hyperlipidemia: Secondary | ICD-10-CM | POA: Diagnosis not present

## 2022-09-14 DIAGNOSIS — E1169 Type 2 diabetes mellitus with other specified complication: Secondary | ICD-10-CM | POA: Diagnosis not present

## 2022-09-14 DIAGNOSIS — I10 Essential (primary) hypertension: Secondary | ICD-10-CM | POA: Diagnosis not present

## 2022-10-19 DIAGNOSIS — E1169 Type 2 diabetes mellitus with other specified complication: Secondary | ICD-10-CM | POA: Diagnosis not present

## 2022-10-19 DIAGNOSIS — L609 Nail disorder, unspecified: Secondary | ICD-10-CM | POA: Diagnosis not present

## 2022-10-19 DIAGNOSIS — I10 Essential (primary) hypertension: Secondary | ICD-10-CM | POA: Diagnosis not present

## 2022-11-01 DIAGNOSIS — M792 Neuralgia and neuritis, unspecified: Secondary | ICD-10-CM | POA: Diagnosis not present

## 2022-11-01 DIAGNOSIS — B351 Tinea unguium: Secondary | ICD-10-CM | POA: Diagnosis not present

## 2023-01-14 DIAGNOSIS — M792 Neuralgia and neuritis, unspecified: Secondary | ICD-10-CM | POA: Diagnosis not present

## 2023-01-14 DIAGNOSIS — B351 Tinea unguium: Secondary | ICD-10-CM | POA: Diagnosis not present

## 2023-01-14 DIAGNOSIS — E1169 Type 2 diabetes mellitus with other specified complication: Secondary | ICD-10-CM | POA: Diagnosis not present

## 2023-01-14 DIAGNOSIS — I739 Peripheral vascular disease, unspecified: Secondary | ICD-10-CM | POA: Diagnosis not present

## 2023-01-14 DIAGNOSIS — I10 Essential (primary) hypertension: Secondary | ICD-10-CM | POA: Diagnosis not present

## 2023-02-01 DIAGNOSIS — B351 Tinea unguium: Secondary | ICD-10-CM | POA: Diagnosis not present

## 2023-02-12 DIAGNOSIS — I10 Essential (primary) hypertension: Secondary | ICD-10-CM | POA: Diagnosis not present

## 2023-02-12 DIAGNOSIS — E785 Hyperlipidemia, unspecified: Secondary | ICD-10-CM | POA: Diagnosis not present

## 2023-02-12 DIAGNOSIS — E1169 Type 2 diabetes mellitus with other specified complication: Secondary | ICD-10-CM | POA: Diagnosis not present

## 2023-03-15 DIAGNOSIS — R7309 Other abnormal glucose: Secondary | ICD-10-CM | POA: Diagnosis not present

## 2023-03-15 DIAGNOSIS — Z0001 Encounter for general adult medical examination with abnormal findings: Secondary | ICD-10-CM | POA: Diagnosis not present

## 2023-03-15 DIAGNOSIS — I10 Essential (primary) hypertension: Secondary | ICD-10-CM | POA: Diagnosis not present

## 2023-03-15 DIAGNOSIS — E1169 Type 2 diabetes mellitus with other specified complication: Secondary | ICD-10-CM | POA: Diagnosis not present

## 2023-03-15 DIAGNOSIS — E7849 Other hyperlipidemia: Secondary | ICD-10-CM | POA: Diagnosis not present

## 2023-07-22 DIAGNOSIS — E7849 Other hyperlipidemia: Secondary | ICD-10-CM | POA: Diagnosis not present

## 2023-07-22 DIAGNOSIS — E1169 Type 2 diabetes mellitus with other specified complication: Secondary | ICD-10-CM | POA: Diagnosis not present

## 2023-07-22 DIAGNOSIS — E782 Mixed hyperlipidemia: Secondary | ICD-10-CM | POA: Diagnosis not present

## 2023-07-22 DIAGNOSIS — E559 Vitamin D deficiency, unspecified: Secondary | ICD-10-CM | POA: Diagnosis not present

## 2023-07-22 DIAGNOSIS — I10 Essential (primary) hypertension: Secondary | ICD-10-CM | POA: Diagnosis not present

## 2023-10-17 ENCOUNTER — Inpatient Hospital Stay (HOSPITAL_COMMUNITY)
Admission: EM | Admit: 2023-10-17 | Discharge: 2023-10-24 | DRG: 683 | Disposition: A | Discharge status: Home Health | Attending: Internal Medicine | Admitting: Internal Medicine

## 2023-10-17 DIAGNOSIS — I6782 Cerebral ischemia: Secondary | ICD-10-CM | POA: Diagnosis not present

## 2023-10-17 DIAGNOSIS — R531 Weakness: Secondary | ICD-10-CM | POA: Diagnosis not present

## 2023-10-17 DIAGNOSIS — Z833 Family history of diabetes mellitus: Secondary | ICD-10-CM | POA: Diagnosis not present

## 2023-10-17 DIAGNOSIS — Z79899 Other long term (current) drug therapy: Secondary | ICD-10-CM

## 2023-10-17 DIAGNOSIS — E871 Hypo-osmolality and hyponatremia: Secondary | ICD-10-CM | POA: Diagnosis not present

## 2023-10-17 DIAGNOSIS — I10 Essential (primary) hypertension: Secondary | ICD-10-CM | POA: Diagnosis not present

## 2023-10-17 DIAGNOSIS — M51369 Other intervertebral disc degeneration, lumbar region without mention of lumbar back pain or lower extremity pain: Secondary | ICD-10-CM | POA: Diagnosis not present

## 2023-10-17 DIAGNOSIS — M545 Low back pain, unspecified: Secondary | ICD-10-CM | POA: Diagnosis not present

## 2023-10-17 DIAGNOSIS — G44309 Post-traumatic headache, unspecified, not intractable: Secondary | ICD-10-CM | POA: Diagnosis not present

## 2023-10-17 DIAGNOSIS — E874 Mixed disorder of acid-base balance: Secondary | ICD-10-CM | POA: Diagnosis not present

## 2023-10-17 DIAGNOSIS — T502X5A Adverse effect of carbonic-anhydrase inhibitors, benzothiadiazides and other diuretics, initial encounter: Secondary | ICD-10-CM | POA: Diagnosis present

## 2023-10-17 DIAGNOSIS — D539 Nutritional anemia, unspecified: Secondary | ICD-10-CM | POA: Diagnosis present

## 2023-10-17 DIAGNOSIS — M4316 Spondylolisthesis, lumbar region: Secondary | ICD-10-CM | POA: Diagnosis present

## 2023-10-17 DIAGNOSIS — Z8249 Family history of ischemic heart disease and other diseases of the circulatory system: Secondary | ICD-10-CM

## 2023-10-17 DIAGNOSIS — E876 Hypokalemia: Secondary | ICD-10-CM | POA: Diagnosis not present

## 2023-10-17 DIAGNOSIS — Z880 Allergy status to penicillin: Secondary | ICD-10-CM

## 2023-10-17 DIAGNOSIS — N179 Acute kidney failure, unspecified: Principal | ICD-10-CM | POA: Diagnosis present

## 2023-10-17 DIAGNOSIS — E441 Mild protein-calorie malnutrition: Secondary | ICD-10-CM | POA: Diagnosis present

## 2023-10-17 DIAGNOSIS — E1165 Type 2 diabetes mellitus with hyperglycemia: Secondary | ICD-10-CM | POA: Diagnosis present

## 2023-10-17 DIAGNOSIS — N289 Disorder of kidney and ureter, unspecified: Secondary | ICD-10-CM

## 2023-10-17 DIAGNOSIS — E785 Hyperlipidemia, unspecified: Secondary | ICD-10-CM | POA: Diagnosis present

## 2023-10-17 DIAGNOSIS — F1010 Alcohol abuse, uncomplicated: Secondary | ICD-10-CM | POA: Diagnosis present

## 2023-10-17 DIAGNOSIS — Z6829 Body mass index (BMI) 29.0-29.9, adult: Secondary | ICD-10-CM

## 2023-10-17 DIAGNOSIS — Y92009 Unspecified place in unspecified non-institutional (private) residence as the place of occurrence of the external cause: Secondary | ICD-10-CM

## 2023-10-17 DIAGNOSIS — M48061 Spinal stenosis, lumbar region without neurogenic claudication: Secondary | ICD-10-CM | POA: Diagnosis present

## 2023-10-17 DIAGNOSIS — R200 Anesthesia of skin: Secondary | ICD-10-CM | POA: Diagnosis present

## 2023-10-17 DIAGNOSIS — M6282 Rhabdomyolysis: Secondary | ICD-10-CM | POA: Diagnosis present

## 2023-10-17 DIAGNOSIS — M25551 Pain in right hip: Secondary | ICD-10-CM | POA: Diagnosis not present

## 2023-10-17 DIAGNOSIS — I499 Cardiac arrhythmia, unspecified: Secondary | ICD-10-CM | POA: Diagnosis not present

## 2023-10-17 DIAGNOSIS — M47816 Spondylosis without myelopathy or radiculopathy, lumbar region: Secondary | ICD-10-CM | POA: Diagnosis not present

## 2023-10-17 DIAGNOSIS — M79661 Pain in right lower leg: Secondary | ICD-10-CM | POA: Diagnosis not present

## 2023-10-17 DIAGNOSIS — M1711 Unilateral primary osteoarthritis, right knee: Secondary | ICD-10-CM | POA: Diagnosis not present

## 2023-10-17 DIAGNOSIS — W19XXXA Unspecified fall, initial encounter: Secondary | ICD-10-CM | POA: Diagnosis not present

## 2023-10-17 DIAGNOSIS — E872 Acidosis, unspecified: Secondary | ICD-10-CM | POA: Diagnosis present

## 2023-10-17 DIAGNOSIS — W010XXA Fall on same level from slipping, tripping and stumbling without subsequent striking against object, initial encounter: Secondary | ICD-10-CM | POA: Diagnosis present

## 2023-10-17 DIAGNOSIS — E873 Alkalosis: Secondary | ICD-10-CM | POA: Diagnosis present

## 2023-10-17 DIAGNOSIS — R7981 Abnormal blood-gas level: Secondary | ICD-10-CM | POA: Diagnosis present

## 2023-10-17 NOTE — ED Triage Notes (Signed)
 Pt arriving via [facility] from home with complaint of right leg weakness/tingling. Pt reports taking potassium and has had cramps in that leg as well. Pt fell tonight trying to get to the door for EMS.

## 2023-10-18 ENCOUNTER — Emergency Department (HOSPITAL_COMMUNITY)

## 2023-10-18 ENCOUNTER — Other Ambulatory Visit: Payer: Self-pay

## 2023-10-18 ENCOUNTER — Encounter (HOSPITAL_COMMUNITY): Payer: Self-pay | Admitting: Internal Medicine

## 2023-10-18 DIAGNOSIS — E785 Hyperlipidemia, unspecified: Secondary | ICD-10-CM | POA: Diagnosis present

## 2023-10-18 DIAGNOSIS — E873 Alkalosis: Secondary | ICD-10-CM | POA: Diagnosis present

## 2023-10-18 DIAGNOSIS — F1010 Alcohol abuse, uncomplicated: Secondary | ICD-10-CM | POA: Diagnosis present

## 2023-10-18 DIAGNOSIS — I10 Essential (primary) hypertension: Secondary | ICD-10-CM | POA: Diagnosis present

## 2023-10-18 DIAGNOSIS — M51369 Other intervertebral disc degeneration, lumbar region without mention of lumbar back pain or lower extremity pain: Secondary | ICD-10-CM | POA: Diagnosis present

## 2023-10-18 DIAGNOSIS — D539 Nutritional anemia, unspecified: Secondary | ICD-10-CM | POA: Diagnosis present

## 2023-10-18 DIAGNOSIS — E1165 Type 2 diabetes mellitus with hyperglycemia: Secondary | ICD-10-CM | POA: Diagnosis present

## 2023-10-18 DIAGNOSIS — N179 Acute kidney failure, unspecified: Secondary | ICD-10-CM | POA: Diagnosis not present

## 2023-10-18 DIAGNOSIS — R7981 Abnormal blood-gas level: Secondary | ICD-10-CM | POA: Diagnosis present

## 2023-10-18 DIAGNOSIS — R739 Hyperglycemia, unspecified: Secondary | ICD-10-CM | POA: Insufficient documentation

## 2023-10-18 DIAGNOSIS — E871 Hypo-osmolality and hyponatremia: Secondary | ICD-10-CM | POA: Diagnosis present

## 2023-10-18 DIAGNOSIS — E876 Hypokalemia: Principal | ICD-10-CM | POA: Diagnosis present

## 2023-10-18 DIAGNOSIS — E441 Mild protein-calorie malnutrition: Secondary | ICD-10-CM | POA: Diagnosis present

## 2023-10-18 LAB — COMPREHENSIVE METABOLIC PANEL
ALT: 22 U/L (ref 0–44)
AST: 21 U/L (ref 15–41)
Albumin: 3.3 g/dL — ABNORMAL LOW (ref 3.5–5.0)
Alkaline Phosphatase: 79 U/L (ref 38–126)
Anion gap: 15 (ref 5–15)
BUN: 55 mg/dL — ABNORMAL HIGH (ref 8–23)
CO2: 19 mmol/L — ABNORMAL LOW (ref 22–32)
Calcium: 9.6 mg/dL (ref 8.9–10.3)
Chloride: 96 mmol/L — ABNORMAL LOW (ref 98–111)
Creatinine, Ser: 1.97 mg/dL — ABNORMAL HIGH (ref 0.44–1.00)
GFR, Estimated: 26 mL/min — ABNORMAL LOW (ref >60)
Glucose, Bld: 205 mg/dL — ABNORMAL HIGH (ref 70–99)
Potassium: 2.7 mmol/L — CL (ref 3.5–5.1)
Sodium: 130 mmol/L — ABNORMAL LOW (ref 135–145)
Total Bilirubin: 1 mg/dL (ref 0.0–1.2)
Total Protein: 7.5 g/dL (ref 6.5–8.1)

## 2023-10-18 LAB — CBC WITH DIFFERENTIAL/PLATELET
Abs Immature Granulocytes: 0.02 10*3/uL (ref 0.00–0.07)
Basophils Absolute: 0 10*3/uL (ref 0.0–0.1)
Basophils Relative: 1 %
Eosinophils Absolute: 0 10*3/uL (ref 0.0–0.5)
Eosinophils Relative: 0 %
HCT: 33.7 % — ABNORMAL LOW (ref 36.0–46.0)
Hemoglobin: 11.4 g/dL — ABNORMAL LOW (ref 12.0–15.0)
Immature Granulocytes: 0 %
Lymphocytes Relative: 14 %
Lymphs Abs: 0.9 10*3/uL (ref 0.7–4.0)
MCH: 34.3 pg — ABNORMAL HIGH (ref 26.0–34.0)
MCHC: 33.8 g/dL (ref 30.0–36.0)
MCV: 101.5 fL — ABNORMAL HIGH (ref 80.0–100.0)
Monocytes Absolute: 0.7 10*3/uL (ref 0.1–1.0)
Monocytes Relative: 11 %
Neutro Abs: 4.8 10*3/uL (ref 1.7–7.7)
Neutrophils Relative %: 74 %
Platelets: 218 10*3/uL (ref 150–400)
RBC: 3.32 MIL/uL — ABNORMAL LOW (ref 3.87–5.11)
RDW: 12.4 % (ref 11.5–15.5)
WBC: 6.5 10*3/uL (ref 4.0–10.5)
nRBC: 0 % (ref 0.0–0.2)

## 2023-10-18 LAB — PHOSPHORUS: Phosphorus: 2.8 mg/dL (ref 2.5–4.6)

## 2023-10-18 LAB — IRON AND TIBC
Iron: 55 ug/dL (ref 28–170)
Saturation Ratios: 21 % (ref 10.4–31.8)
TIBC: 260 ug/dL (ref 250–450)
UIBC: 205 ug/dL

## 2023-10-18 LAB — GLUCOSE, CAPILLARY: Glucose-Capillary: 106 mg/dL — ABNORMAL HIGH (ref 70–99)

## 2023-10-18 LAB — ETHANOL: Alcohol, Ethyl (B): <10 mg/dL (ref <10)

## 2023-10-18 LAB — BASIC METABOLIC PANEL
Anion gap: 11 (ref 5–15)
BUN: 49 mg/dL — ABNORMAL HIGH (ref 8–23)
CO2: 21 mmol/L — ABNORMAL LOW (ref 22–32)
Calcium: 9.4 mg/dL (ref 8.9–10.3)
Chloride: 100 mmol/L (ref 98–111)
Creatinine, Ser: 1.64 mg/dL — ABNORMAL HIGH (ref 0.44–1.00)
GFR, Estimated: 32 mL/min — ABNORMAL LOW (ref >60)
Glucose, Bld: 120 mg/dL — ABNORMAL HIGH (ref 70–99)
Potassium: 3.2 mmol/L — ABNORMAL LOW (ref 3.5–5.1)
Sodium: 132 mmol/L — ABNORMAL LOW (ref 135–145)

## 2023-10-18 LAB — MAGNESIUM: Magnesium: 1.4 mg/dL — ABNORMAL LOW (ref 1.7–2.4)

## 2023-10-18 LAB — FERRITIN: Ferritin: 227 ng/mL (ref 11–307)

## 2023-10-18 LAB — FOLATE: Folate: 12.7 ng/mL (ref >5.9)

## 2023-10-18 LAB — CK: Total CK: 1034 U/L — ABNORMAL HIGH (ref 38–234)

## 2023-10-18 LAB — VITAMIN B12: Vitamin B-12: 329 pg/mL (ref 180–914)

## 2023-10-18 MED ORDER — NEBIVOLOL HCL 10 MG PO TABS
10.0000 mg | ORAL_TABLET | Freq: Every day | ORAL | Status: DC
  Administered 2023-10-18 – 2023-10-24 (×7): 10 mg via ORAL
  Filled 2023-10-18 (×7): qty 1

## 2023-10-18 MED ORDER — ACETAMINOPHEN 325 MG PO TABS
650.0000 mg | ORAL_TABLET | Freq: Four times a day (QID) | ORAL | Status: DC | PRN
  Administered 2023-10-18 – 2023-10-20 (×3): 650 mg via ORAL
  Filled 2023-10-18 (×3): qty 2

## 2023-10-18 MED ORDER — LORAZEPAM 1 MG PO TABS
0.0000 mg | ORAL_TABLET | Freq: Two times a day (BID) | ORAL | Status: AC
  Filled 2023-10-18: qty 1

## 2023-10-18 MED ORDER — LORAZEPAM 1 MG PO TABS: 0.0000 mg | ORAL_TABLET | Freq: Four times a day (QID) | ORAL | Status: AC

## 2023-10-18 MED ORDER — FENTANYL CITRATE PF 50 MCG/ML IJ SOSY
50.0000 ug | PREFILLED_SYRINGE | Freq: Once | INTRAMUSCULAR | Status: AC
  Administered 2023-10-18: 50 ug via INTRAVENOUS
  Filled 2023-10-18: qty 1

## 2023-10-18 MED ORDER — THIAMINE MONONITRATE 100 MG PO TABS
100.0000 mg | ORAL_TABLET | Freq: Every day | ORAL | Status: DC
  Administered 2023-10-18 – 2023-10-19 (×2): 100 mg via ORAL
  Filled 2023-10-18 (×2): qty 1

## 2023-10-18 MED ORDER — POTASSIUM CHLORIDE 10 MEQ/100ML IV SOLN
10.0000 meq | INTRAVENOUS | Status: AC
  Administered 2023-10-18 (×2): 10 meq via INTRAVENOUS
  Filled 2023-10-18 (×2): qty 100

## 2023-10-18 MED ORDER — ACETAMINOPHEN 650 MG RE SUPP: 650.0000 mg | Freq: Four times a day (QID) | RECTAL | Status: DC | PRN

## 2023-10-18 MED ORDER — THIAMINE HCL 100 MG/ML IJ SOLN
100.0000 mg | Freq: Every day | INTRAMUSCULAR | Status: DC
Start: 2023-10-18 — End: 2023-10-19
  Filled 2023-10-18 (×2): qty 2

## 2023-10-18 MED ORDER — ACETAMINOPHEN 500 MG PO TABS
1000.0000 mg | ORAL_TABLET | Freq: Once | ORAL | Status: AC
Start: 2023-10-18 — End: 2023-10-18
  Administered 2023-10-18: 1000 mg via ORAL
  Filled 2023-10-18: qty 2

## 2023-10-18 MED ORDER — LORAZEPAM 2 MG/ML IJ SOLN: 1.0000 mg | INTRAMUSCULAR | Status: AC | PRN

## 2023-10-18 MED ORDER — ADULT MULTIVITAMIN W/MINERALS CH
1.0000 | ORAL_TABLET | Freq: Every day | ORAL | Status: DC
  Administered 2023-10-18 – 2023-10-24 (×7): 1 via ORAL
  Filled 2023-10-18 (×7): qty 1

## 2023-10-18 MED ORDER — LORAZEPAM 1 MG PO TABS: 1.0000 mg | ORAL_TABLET | ORAL | Status: AC | PRN

## 2023-10-18 MED ORDER — LACTATED RINGERS IV SOLN
INTRAVENOUS | Status: AC
  Administered 2023-10-19: 75 mL/h via INTRAVENOUS

## 2023-10-18 MED ORDER — MAGNESIUM SULFATE 2 GM/50ML IV SOLN
2.0000 g | Freq: Once | INTRAVENOUS | Status: AC
  Administered 2023-10-18: 2 g via INTRAVENOUS
  Filled 2023-10-18: qty 50

## 2023-10-18 MED ORDER — POTASSIUM CHLORIDE CRYS ER 20 MEQ PO TBCR
20.0000 meq | EXTENDED_RELEASE_TABLET | Freq: Once | ORAL | Status: AC
  Administered 2023-10-18: 20 meq via ORAL
  Filled 2023-10-18: qty 1

## 2023-10-18 MED ORDER — FOLIC ACID 1 MG PO TABS
1.0000 mg | ORAL_TABLET | Freq: Every day | ORAL | Status: DC
  Administered 2023-10-18 – 2023-10-24 (×7): 1 mg via ORAL
  Filled 2023-10-18 (×8): qty 1

## 2023-10-18 NOTE — Evaluation (Signed)
 Occupational Therapy Evaluation Patient Details Name: [name] MRN: [id number] DOB: 1945-08-21 Today's Date: 10/18/2023   History of Present Illness   78 yo female presented to ED on 10/17/2023 due to mechanical fall in home setting, and R LE weakness, AKI and abn labs. 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 Imaging of R hip and back negative for acute findings.     Clinical Impressions Patient admitted for the diagnosis above.  PTA she lives alone at home, walks without an AD, and reports independence with ADL and iADL.  Family stops by to check on her and assist with bill payment.  Currently she is reporting R LE weakness, appears unsteady, and could only take steps to a chair near the ED stretcher with a RW.  Min A to supervision overall.  OT will follow in the acute setting, and the patient would prefer to return home with [facility], but she does not have 24 hour assist as needed for home.  SNF may need to be considered if she does not progress to at least Mod I.  PT Consult is pending.       If plan is discharge home, recommend the following:   Assist for transportation;Assistance with cooking/housework;A little help with walking and/or transfers;A little help with bathing/dressing/bathroom     Functional Status Assessment   Patient has had a recent decline in their functional status and demonstrates the ability to make significant improvements in function in a reasonable and predictable amount of time.     Equipment Recommendations   Tub/shower seat     Recommendations for Other Services         Precautions/Restrictions   Precautions Precautions: Fall Recall of Precautions/Restrictions: Intact Precaution/Restrictions Comments: compalins of R leg weakness Restrictions Weight Bearing Restrictions Per Provider Order: No     Mobility Bed Mobility Overal bed mobility: Needs Assistance Bed Mobility: Supine to Sit, Sit to Supine     Supine to sit: Min  assist Sit to supine: Supervision        Transfers Overall transfer level: Needs assistance   Transfers: Sit to/from Stand, Bed to chair/wheelchair/BSC Sit to Stand: Min assist     Step pivot transfers: Contact guard assist, Min assist     General transfer comment: patient feeling weaker than baseline, describes being shakey.  Not steady without RW.      Balance Overall balance assessment: Needs assistance Sitting-balance support: Feet supported Sitting balance-Leahy Scale: Good     Standing balance support: Reliant on assistive device for balance Standing balance-Leahy Scale: Poor                             ADL either performed or assessed with clinical judgement   ADL Overall ADL's : Needs assistance/impaired Eating/Feeding: Set up;Sitting   Grooming: Wash/dry hands;Sitting;Set up   Upper Body Bathing: Set up;Sitting   Lower Body Bathing: Minimal assistance;Sit to/from stand   Upper Body Dressing : Supervision/safety;Sitting   Lower Body Dressing: Minimal assistance;Sit to/from stand   Toilet Transfer: Contact guard assist;BSC/3in1;Stand-pivot;Rolling walker (2 wheels)                   Vision Baseline Vision/History: 1 Wears glasses Patient Visual Report: No change from baseline       Perception Perception: Not tested       Praxis Praxis: Not tested       Pertinent Vitals/Pain Pain Assessment Pain Assessment: Faces Faces Pain  Scale: No hurt Pain Intervention(s): Monitored during session     Extremity/Trunk Assessment Upper Extremity Assessment Upper Extremity Assessment: Generalized weakness   Lower Extremity Assessment Lower Extremity Assessment: Defer to PT evaluation   Cervical / Trunk Assessment Cervical / Trunk Assessment: Normal   Communication Communication Communication: No apparent difficulties   Cognition Arousal: Alert Behavior During Therapy: WFL for tasks assessed/performed Cognition: No apparent  impairments                               Following commands: Intact       Cueing  General Comments   Cueing Techniques: Verbal cues   VSS on RA   Exercises     Shoulder Instructions      Home Living Family/patient expects to be discharged to:: Private residence Living Arrangements: Alone Available Help at Discharge: Family;Available PRN/intermittently Type of Home: House Home Access: Level entry     Home Layout: One level     Bathroom Shower/Tub: Chief Strategy Officer: Standard Bathroom Accessibility: Yes How Accessible: Accessible via walker Home Equipment: None          Prior Functioning/Environment Prior Level of Function : Independent/Modified Independent             Mobility Comments: Walks without an AD at baseline. ADLs Comments: Reports Ind with ADL, iADL, medications.  Family assists with community mobility and helps with bill payment.    OT Problem List: Impaired balance (sitting and/or standing);Decreased activity tolerance;Decreased strength   OT Treatment/Interventions: Self-care/ADL training;Therapeutic activities;Patient/family education;Balance training;DME and/or AE instruction      OT Goals(Current goals can be found in the care plan section)   Acute Rehab OT Goals Patient Stated Goal: Hoping to go home tomorrow OT Goal Formulation: With patient Time For Goal Achievement: 11/01/23 Potential to Achieve Goals: Good   OT Frequency:  Min 1X/week    Co-evaluation              AM-PAC OT "6 Clicks" Daily Activity     Outcome Measure Help from another person eating meals?: None Help from another person taking care of personal grooming?: None Help from another person toileting, which includes using toliet, bedpan, or urinal?: A Little Help from another person bathing (including washing, rinsing, drying)?: A Little Help from another person to put on and taking off regular upper body clothing?:  None Help from another person to put on and taking off regular lower body clothing?: A Little 6 Click Score: 21   End of Session Equipment Utilized During Treatment: Gait belt;Rolling walker (2 wheels) Nurse Communication: Mobility status  Activity Tolerance: Patient tolerated treatment well Patient left: in bed;with call bell/phone within reach  OT Visit Diagnosis: Unsteadiness on feet (R26.81);Muscle weakness (generalized) (M62.81)                Time: 1610-9604 OT Time Calculation (min): 24 min Charges:  OT General Charges $OT Visit: 1 Visit OT Evaluation $OT Eval Moderate Complexity: 1 Mod OT Treatments $Self Care/Home Management : 8-22 mins  10/18/2023  RP, OTR/L  Acute Rehabilitation Services  Office:  [phone]   Suzanna Obey 10/18/2023, 12:06 PM

## 2023-10-18 NOTE — ED Provider Notes (Signed)
 [place] EMERGENCY DEPARTMENT AT [facility] Provider Note  CSN: [id number] Arrival date & time: 10/17/23 2242  Chief Complaint(s) Weakness  HPI [name] is a 78 y.o. female here for right leg pain and mechanical fall while at home this evening.  Patient and family reports that she has been dealing with right leg discomfort and "issues" (difficulty walking) for the past 1 to 2 months.  She has been requiring the use of furniture and walls to keep her balance while ambulating at home.  She does not use a cane or a walker.  This evening, walking open area in her in her home, she tripped and fell, landing on her right hip.  She denies any other injuries from the fall.  Reports pain in the right calve and lateral hip.  Denies any headache, neck pain, back pain, chest pain, abdominal pain or other extremity pain.  Patient does admit to almost daily use of alcohol.  She is not on a blood thinner.  The history is provided by the patient.    Past Medical History No past medical history on file. Patient Active Problem List   Diagnosis Date Noted   AKI (acute kidney injury) (HCC) 10/18/2023   Home Medication(s) Prior to Admission medications   Medication Sig Start Date End Date Taking? Authorizing Provider  acetaminophen (TYLENOL) 500 MG tablet Take 500 mg by mouth every 6 (six) hours as needed.      [provider]  lisinopril (PRINIVIL,ZESTRIL) 5 MG tablet Take 5 mg by mouth daily.      [provider]  Multiple Vitamin (MULTIVITAMIN) capsule Take 1 capsule by mouth daily.      [provider]  naproxen sodium (ANAPROX) 220 MG tablet Take 220 mg by mouth 2 (two) times daily with a meal.      [provider]  oxyCODONE-acetaminophen (PERCOCET) 5-325 MG per tablet Take 1 tablet by mouth every 4 (four) hours as needed.      [provider]                                                                                                                                     Allergies Penicillins  Review of Systems Review of Systems As noted in HPI  Physical Exam Vital Signs  I have reviewed the triage vital signs BP (!) 143/71   Pulse 63   Temp 97.7 F (36.5 C) (Oral)   Resp 19   SpO2 98%   Physical Exam Vitals reviewed.  Constitutional:      General: She is not in acute distress.    Appearance: She is well-developed. She is not diaphoretic.  HENT:     Head: Normocephalic and atraumatic.     Right Ear: External ear normal.     Left Ear: External ear normal.     Nose: Nose normal.  Eyes:     General:  No scleral icterus.    Conjunctiva/sclera: Conjunctivae normal.  Neck:     Trachea: Phonation normal.  Cardiovascular:     Rate and Rhythm: Normal rate and regular rhythm.  Pulmonary:     Effort: Pulmonary effort is normal. No respiratory distress.     Breath sounds: No stridor.  Abdominal:     General: There is no distension.  Musculoskeletal:        General: Normal range of motion.     Cervical back: Normal range of motion.     Right hip: Tenderness present. No deformity or bony tenderness.     Right lower leg: Tenderness (to calf) present. No swelling, lacerations or bony tenderness. No edema.     Right ankle: No swelling. No tenderness.     Right foot: Foot drop present.  Neurological:     Mental Status: She is alert and oriented to person, place, and time.     Comments: Mental Status:  Alert and oriented to person, place, and time.  Attention and concentration normal.  Speech clear.  Recent memory is intact  Cranial Nerves:  II Visual Fields: Intact to confrontation. Visual fields intact. III, IV, VI: Pupils equal and reactive to light and near. Full eye movement without nystagmus  V Facial Sensation: Normal. No weakness of masticatory muscles  VII: No facial weakness or asymmetry  VIII Auditory Acuity: Grossly normal  IX/X: The uvula is midline; the palate elevates symmetrically  XI: Normal  sternocleidomastoid and trapezius strength  XII: The tongue is midline. No atrophy or fasciculations.   Motor System: Muscle Strength: 5/5 and symmetric in the upper extremities. 5/5 bilateral hip flexion, knee ext/flex and left foot dorsal/plantar flexion. 2/5 right foot dorsal/plantar flexion. No pronation or drift.  Muscle Tone: Tone and muscle bulk are normal in the upper and lower extremities.  Coordination: Intact finger-to-nose. Resting tremor noted to BUE Sensation: Intact to light touch Gait: deferred   Psychiatric:        Behavior: Behavior normal.     ED Results and Treatments Labs (all labs ordered are listed, but only abnormal results are displayed) Labs Reviewed  CBC WITH DIFFERENTIAL/PLATELET - Abnormal; Notable for the following components:      Result Value   RBC 3.32 (*)    Hemoglobin 11.4 (*)    HCT 33.7 (*)    MCV 101.5 (*)    MCH 34.3 (*)    All other components within normal limits  COMPREHENSIVE METABOLIC PANEL - Abnormal; Notable for the following components:   Sodium 130 (*)    Potassium 2.7 (*)    Chloride 96 (*)    CO2 19 (*)    Glucose, Bld 205 (*)    BUN 55 (*)    Creatinine, Ser 1.97 (*)    Albumin 3.3 (*)    GFR, Estimated 26 (*)    All other components within normal limits  MAGNESIUM - Abnormal; Notable for the following components:   Magnesium 1.4 (*)    All other components within normal limits  ETHANOL  EKG  EKG Interpretation Date/Time:  Friday October 18 2023 02:20:02 EST Ventricular Rate:  63 PR Interval:  183 QRS Duration:  99 QT Interval:  461 QTC Calculation: 472 R Axis:   23  Text Interpretation: Sinus rhythm Atrial premature complex Abnormal R-wave progression, early transition Confirmed by Drema Pry [id number]) on 10/18/2023 4:37:20 AM       Radiology CT Head Wo Contrast Result Date:  10/18/2023 CLINICAL DATA:  Recent fall with headaches, initial encounter EXAM: CT HEAD WITHOUT CONTRAST TECHNIQUE: Contiguous axial images were obtained from the base of the skull through the vertex without intravenous contrast. RADIATION DOSE REDUCTION: This exam was performed according to the departmental dose-optimization program which includes automated exposure control, adjustment of the mA and/or kV according to patient size and/or use of iterative reconstruction technique. COMPARISON:  None Available. FINDINGS: Brain: No evidence of acute infarction, hemorrhage, hydrocephalus, extra-axial collection or mass lesion/mass effect. Chronic atrophic and ischemic changes are noted. Vascular: No hyperdense vessel or unexpected calcification. Skull: Normal. Negative for fracture or focal lesion. Sinuses/Orbits: No acute finding. Other: None. IMPRESSION: Chronic atrophic and ischemic changes without acute abnormality. Electronically Signed   By: Alcide Clever M.D.   On: 10/18/2023 03:25   DG Lumbar Spine 2-3 Views Result Date: 10/18/2023 CLINICAL DATA:  Recent fall with low back pain, initial encounter EXAM: LUMBAR SPINE - 3 VIEW COMPARISON:  None Available. FINDINGS: Five lumbar type vertebral bodies are well visualized. Vertebral body height is well maintained. Osteophytic changes are noted with mild anterolisthesis of L4 on L5. Facet hypertrophic changes are seen. No soft tissue abnormality is noted. 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 Electronically Signed   By: Alcide Clever M.D.   On: 10/18/2023 03:18   DG HIP UNILAT W OR W/O PELVIS 2-3 VIEWS RIGHT Result Date: 10/18/2023 CLINICAL DATA:  Recent fall with right hip pain, initial encounter EXAM: DG HIP (WITH OR WITHOUT PELVIS) 3V RIGHT COMPARISON:  None Available. FINDINGS: Pelvic ring is intact. Calcified uterine fibroids are noted. No acute fracture or dislocation is noted. No soft tissue changes are seen. IMPRESSION: No acute  abnormality noted. Electronically Signed   By: Alcide Clever M.D.   On: 10/18/2023 03:16    Medications Ordered in ED Medications  acetaminophen (TYLENOL) tablet 650 mg (has no administration in time range)    Or  acetaminophen (TYLENOL) suppository 650 mg (has no administration in time range)  lactated ringers infusion ( Intravenous New Bag/Given 10/18/23 0624)  fentaNYL (SUBLIMAZE) injection 50 mcg (50 mcg Intravenous Given 10/18/23 0201)  potassium chloride 10 mEq in 100 mL IVPB (0 mEq Intravenous Stopped 10/18/23 0524)  magnesium sulfate IVPB 2 g 50 mL (0 g Intravenous Stopped 10/18/23 0622)  acetaminophen (TYLENOL) tablet 1,000 mg (1,000 mg Oral Given 10/18/23 0519)   Procedures Procedures  (including critical care time) Medical Decision Making / ED Course   Medical Decision Making Amount and/or Complexity of Data Reviewed Labs: ordered. Decision-making details documented in ED Course. Radiology: ordered and independent interpretation performed. Decision-making details documented in ED Course. ECG/medicine tests: ordered and independent interpretation performed. Decision-making details documented in ED Course.  Risk OTC drugs. Prescription drug management. Decision regarding hospitalization.    Patient presents after a mechanical fall.  Noted to have right foot drop.  Seems to be remote given history provided by patient and family.  Patient does not meet code stroke criteria given timeline.  CT head did reveal remote ischemic changes.  No ICH. CBC  without leukocytosis.  Mild anemia. Metabolic panel did reveal evidence of mild hyponatremia, hypokalemia at 2.7.  Patient also has hypomagnesemia 1.4.  Hyperglycemia without DKA.Marland Kitchen  Renal insufficiency of unknown chronicity.  Last labs documented were from 12 years ago.  X-rays of hip and lumbar region negative for any acute fracture.  No bony lesions.  Right lower leg pain is muscular. No bone tenderness concerning for fracture that  requires xray.  Admitted for continued electrolyte repletion.    Final Clinical Impression(s) / ED Diagnoses Final diagnoses:  Hypokalemia  Hypomagnesemia  Renal insufficiency    This chart was dictated using voice recognition software.  Despite best efforts to proofread,  errors can occur which can change the documentation meaning.    Nira Conn, MD 10/18/23 [phone]

## 2023-10-18 NOTE — ED Notes (Signed)
 Patient transported to X-ray

## 2023-10-18 NOTE — ED Notes (Signed)
 Report nuwo, rn

## 2023-10-18 NOTE — Progress Notes (Signed)
  Carryover admission to the Day Admitter.  I discussed this case with the EDP, Dr. Eudelia Bunch.  Per these discussions:   This is a 78 year old female who is being admitted for suspected acute kidney injury as well as hypokalemia and hypomagnesemia after presenting with, mechanical fall at home, without loss of consciousness.  Not on any blood thinners.  The patient conveys that she has been experiencing persistent right lower extremity weakness for the last 1 to 2 months, without any recent worsening thereof.  Over that timeframe, she conveys that she typically relies up on furniture in the wall to steady herself to prevent falls in the setting of his right lower extremity weakness.  However, yesterday, she needed to ambulate over an open area without availability of walls or furniture to steady herself.  It was at this time that denies any acute arthralgias or myalgias.  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  Presenting labs notable for creatinine of 2.0, without any prior creatinine data points available, including per review of care everywhere.  Additionally, her magnesium and potassium levels are found to be low.  She has received 2 g of IV magnesium sulfate as well as 20 mill equivalents of IV potassium chloride in the emergency department this evening.  I have placed an order for observation to med/tele for further evaluation management of presumed acute kidney injury as well as hypokalemia and hypomagnesemia.  I have placed some additional preliminary admit orders via the adult multi-morbid admission order set. I have also ordered lactated Ringer's at 75 cc/h.  For her subacute right lower extremity weakness, ordered fall precautions, as well as PT/OT consults to prn the morning.    Newton Pigg, DO Hospitalist

## 2023-10-18 NOTE — Evaluation (Signed)
 Physical Therapy Evaluation Patient Details Name: [name] MRN: [id number] DOB: 1945/10/31 Today's Date: 10/18/2023  History of Present Illness  78 yo female presented to ED on 10/17/2023 due to mechanical fall in home setting, and R LE weakness, AKI and abn labs. 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 Imaging of R hip and back negative for acute findings. PMH unremarkable.  Clinical Impression    Pt admitted with above diagnosis.  Pt currently with functional limitations due to the deficits listed below (see PT Problem List). PT eval completed in ED. Pt agreeable to therapy intervention. Pt required CGA and increased time for supine <> sit with encouragement for increased IND, CGA for sit to stand  from EOB to RW, pt agreeable to gait assessment with RW in room only with CGA and cues noted difficulty with obstacle navigation on L side with cues for proper body position and posture with RW management. Pt left in bed all needs in place.  Pending pt progress and support system in home setting pt may require < 3 hrs of skilled therapy intervention vs HH services.  Pt will benefit from acute skilled PT to increase their independence and safety with mobility to allow discharge.         If plan is discharge home, recommend the following: A little help with walking and/or transfers;A little help with bathing/dressing/bathroom;Assistance with cooking/housework;Assist for transportation   Can travel by private vehicle        Equipment Recommendations Rolling walker (2 wheels)  Recommendations for Other Services       Functional Status Assessment Patient has had a recent decline in their functional status and demonstrates the ability to make significant improvements in function in a reasonable and predictable amount of time.     Precautions / Restrictions Precautions Precautions: Fall Recall of Precautions/Restrictions: Intact Precaution/Restrictions Comments: compalins of R leg  weakness Restrictions Weight Bearing Restrictions Per Provider Order: No      Mobility  Bed Mobility Overal bed mobility: Needs Assistance Bed Mobility: Supine to Sit, Sit to Supine     Supine to sit: Contact guard Sit to supine: Supervision, Contact guard assist   General bed mobility comments: min cues and increased time with increased time and cues for IND    Transfers Overall transfer level: Needs assistance Equipment used: Rolling walker (2 wheels) Transfers: Sit to/from Stand Sit to Stand: Contact guard assist           General transfer comment: noted mild instability with inital standing and pt able to maintain static standing with B UE support at RW with CGA and ceus    Ambulation/Gait Ambulation/Gait assistance: Contact guard assist Gait Distance (Feet): 15 Feet Assistive device: Rolling walker (2 wheels) Gait Pattern/deviations: Decreased step length - right, Decreased step length - left, Shuffle, Trunk flexed Gait velocity: decreased     General Gait Details: slight trunk flexion, no noted R LE instabiltiy or overt LOB with gait trial, pt  reported she did not want to go out of the room today, cues for posture, proper distance from RW and maintaining body position inside RW with turns. noted to have some deficits identifying obstacles on L side  Stairs            Wheelchair Mobility     Tilt Bed    Modified Rankin (Stroke Patients Only)       Balance Overall balance assessment: Needs assistance Sitting-balance support: Feet supported Sitting balance-Leahy Scale: Good  Standing balance support: Reliant on assistive device for balance, Bilateral upper extremity supported, During functional activity Standing balance-Leahy Scale: Poor                               Pertinent Vitals/Pain Pain Assessment Pain Assessment: No/denies pain    Home Living Family/patient expects to be discharged to:: Private residence Living  Arrangements: Alone Available Help at Discharge: Family;Available PRN/intermittently Type of Home: House Home Access: Level entry       Home Layout: One level Home Equipment: None      Prior Function Prior Level of Function : Independent/Modified Independent             Mobility Comments: Walks without an AD at baseline. ADLs Comments: Reports Ind with ADL, iADL, medications.  Family assists with community mobility and helps with bill payment.     Extremity/Trunk Assessment   Upper Extremity Assessment Upper Extremity Assessment: Generalized weakness    Lower Extremity Assessment Lower Extremity Assessment: Generalized weakness (pt denies abn sensation)    Cervical / Trunk Assessment Cervical / Trunk Assessment: Normal  Communication   Communication Communication: No apparent difficulties    Cognition Arousal: Alert Behavior During Therapy: WFL for tasks assessed/performed   PT - Cognitive impairments: No apparent impairments                         Following commands: Intact       Cueing Cueing Techniques: Verbal cues     General Comments      Exercises     Assessment/Plan    PT Assessment Patient needs continued PT services  PT Problem List Decreased strength;Decreased range of motion;Decreased activity tolerance;Decreased balance;Decreased mobility;Decreased coordination;Decreased knowledge of use of DME       PT Treatment Interventions DME instruction;Gait training;Functional mobility training;Therapeutic activities;Therapeutic exercise;Balance training;Neuromuscular re-education;Patient/family education    PT Goals (Current goals can be found in the Care Plan section)  Acute Rehab PT Goals Patient Stated Goal: to be able to get up and go home today PT Goal Formulation: With patient Time For Goal Achievement: 11/01/23 Potential to Achieve Goals: Good    Frequency Min 3X/week     Co-evaluation               AM-PAC PT  "6 Clicks" Mobility  Outcome Measure Help needed turning from your back to your side while in a flat bed without using bedrails?: None Help needed moving from lying on your back to sitting on the side of a flat bed without using bedrails?: A Little Help needed moving to and from a bed to a chair (including a wheelchair)?: A Little Help needed standing up from a chair using your arms (e.g., wheelchair or bedside chair)?: A Little Help needed to walk in hospital room?: A Little Help needed climbing 3-5 steps with a railing? : A Lot 6 Click Score: 18    End of Session Equipment Utilized During Treatment: Gait belt Activity Tolerance: Patient limited by fatigue Patient left: in bed;with call bell/phone within reach Nurse Communication: Mobility status PT Visit Diagnosis: Unsteadiness on feet (R26.81);Muscle weakness (generalized) (M62.81);Difficulty in walking, not elsewhere classified (R26.2);History of falling (Z91.81)    Time: 1610-9604 PT Time Calculation (min) (ACUTE ONLY): 25 min   Charges:   PT Evaluation $PT Eval Low Complexity: 1 Low PT Treatments $Therapeutic Activity: 8-22 mins PT General Charges $$ ACUTE PT VISIT: 1  Visit         Johnny Bridge, PT Acute Rehab   Jacqualyn Posey 10/18/2023, 2:40 PM

## 2023-10-18 NOTE — Plan of Care (Signed)
  Problem: Education: Goal: Knowledge of General Education information will improve Description: Including pain rating scale, medication(s)/side effects and non-pharmacologic comfort measures Outcome: Progressing   Problem: Nutrition: Goal: Adequate nutrition will be maintained Outcome: Progressing   Problem: Coping: Goal: Level of anxiety will decrease Outcome: Progressing   Problem: Elimination: Goal: Will not experience complications related to bowel motility Outcome: Progressing   Problem: Safety: Goal: Ability to remain free from injury will improve Outcome: Progressing   Problem: Skin Integrity: Goal: Risk for impaired skin integrity will decrease Outcome: Progressing

## 2023-10-18 NOTE — ED Notes (Signed)
 Pt back from XR

## 2023-10-18 NOTE — ED Notes (Signed)
 ED TO INPATIENT HANDOFF REPORT  Name/Age/Gender [name] 78 y.o. female  Code Status    Code Status Orders  (From admission, onward)           Start     Ordered   10/18/23 0556  Full code  Continuous       Question:  By:  Answer:  Consent: discussion documented in EHR   10/18/23 0555           Code Status History     This patient has a current code status but no historical code status.       Home/SNF/Other Home  Chief Complaint AKI (acute kidney injury) (HCC) [N17.9]  Level of Care/Admitting Diagnosis ED Disposition     ED Disposition  Admit   Condition  --   Comment  Hospital Area: [facility] [[id number]]  Level of Care: Telemetry [5]  Admit to tele based on following criteria: Monitor for Ischemic changes  May place patient in observation at [facility] or [facility] if equivalent level of care is available:: No  Covid Evaluation: Asymptomatic - no recent exposure (last 10 days) testing not required  Diagnosis: AKI (acute kidney injury) [facility]) [[id number]]  Admitting Physician: Angie Fava [[id number]]  Attending Physician: Angie Fava [[id number]]          Medical History No past medical history on file.  Allergies Allergies  Allergen Reactions   Penicillins Rash    IV Location/Drains/Wounds Patient Lines/Drains/Airways Status     Active Line/Drains/Airways     Name Placement date Placement time Site Days   Peripheral IV 10/18/23 20 G Anterior;Proximal;Right Forearm 10/18/23  0156  Forearm  less than 1            Labs/Imaging Results for orders placed or performed during the hospital encounter of 10/17/23 (from the past 48 hours)  CBC with Differential     Status: Abnormal   Collection Time: 10/18/23 12:46 AM  Result Value Ref Range   WBC 6.5 4.0 - 10.5 K/uL   RBC 3.32 (L) 3.87 - 5.11 MIL/uL   Hemoglobin 11.4 (L) 12.0 - 15.0 g/dL   HCT 02.7 (L) 25.3 - 66.4 %   MCV 101.5 (H) 80.0 - 100.0 fL   MCH  34.3 (H) 26.0 - 34.0 pg   MCHC 33.8 30.0 - 36.0 g/dL   RDW 40.3 47.4 - 25.9 %   Platelets 218 150 - 400 K/uL   nRBC 0.0 0.0 - 0.2 %   Neutrophils Relative % 74 %   Neutro Abs 4.8 1.7 - 7.7 K/uL   Lymphocytes Relative 14 %   Lymphs Abs 0.9 0.7 - 4.0 K/uL   Monocytes Relative 11 %   Monocytes Absolute 0.7 0.1 - 1.0 K/uL   Eosinophils Relative 0 %   Eosinophils Absolute 0.0 0.0 - 0.5 K/uL   Basophils Relative 1 %   Basophils Absolute 0.0 0.0 - 0.1 K/uL   Immature Granulocytes 0 %   Abs Immature Granulocytes 0.02 0.00 - 0.07 K/uL    Comment: Performed at [facility], [address] [place], [place] [address]  Comprehensive metabolic panel     Status: Abnormal   Collection Time: 10/18/23 12:46 AM  Result Value Ref Range   Sodium 130 (L) 135 - 145 mmol/L   Potassium 2.7 (LL) 3.5 - 5.1 mmol/L    Comment: CRITICAL RESULT CALLED TO, READ BACK BY AND VERIFIED WITH GARNER, E RN @ 0157 ON 10/18/2023  BY MTA    Chloride 96 (L) 98 - 111 mmol/L   CO2 19 (L) 22 - 32 mmol/L   Glucose, Bld 205 (H) 70 - 99 mg/dL    Comment: Glucose reference range applies only to samples taken after fasting for at least 8 hours.   BUN 55 (H) 8 - 23 mg/dL   Creatinine, Ser 1.61 (H) 0.44 - 1.00 mg/dL   Calcium 9.6 8.9 - 09.6 mg/dL   Total Protein 7.5 6.5 - 8.1 g/dL   Albumin 3.3 (L) 3.5 - 5.0 g/dL   AST 21 15 - 41 U/L   ALT 22 0 - 44 U/L   Alkaline Phosphatase 79 38 - 126 U/L   Total Bilirubin 1.0 0.0 - 1.2 mg/dL   GFR, Estimated 26 (L) >60 mL/min    Comment: (NOTE) Calculated using the CKD-EPI Creatinine Equation (2021)    Anion gap 15 5 - 15    Comment: Performed at [facility], [address] [place], [place] [address]  Ethanol     Status: None   Collection Time: 10/18/23 12:46 AM  Result Value Ref Range   Alcohol, Ethyl (B) <10 <10 mg/dL    Comment: (NOTE) Lowest detectable limit for serum alcohol is 10 mg/dL.  For medical purposes only. Performed at [facility], [address], [place], [place] [address]   Magnesium     Status: Abnormal   Collection Time: 10/18/23 12:46 AM  Result Value Ref Range   Magnesium 1.4 (L) 1.7 - 2.4 mg/dL    Comment: Performed at [facility], [address], [place], [place] [address]  Basic metabolic panel     Status: Abnormal   Collection Time: 10/18/23  9:12 AM  Result Value Ref Range   Sodium 132 (L) 135 - 145 mmol/L   Potassium 3.2 (L) 3.5 - 5.1 mmol/L   Chloride 100 98 - 111 mmol/L   CO2 21 (L) 22 - 32 mmol/L   Glucose, Bld 120 (H) 70 - 99 mg/dL    Comment: Glucose reference range applies only to samples taken after fasting for at least 8 hours.   BUN 49 (H) 8 - 23 mg/dL   Creatinine, Ser 9.56 (H) 0.44 - 1.00 mg/dL   Calcium 9.4 8.9 - 21.3 mg/dL   GFR, Estimated 32 (L) >60 mL/min    Comment: (NOTE) Calculated using the CKD-EPI Creatinine Equation (2021)    Anion gap 11 5 - 15    Comment: Performed at [facility], [address] [place], [place] [address]  Phosphorus     Status: None   Collection Time: 10/18/23  9:12 AM  Result Value Ref Range   Phosphorus 2.8 2.5 - 4.6 mg/dL    Comment: Performed at [facility], [address], [place], [place] [address]  Vitamin B12     Status: None   Collection Time: 10/18/23  9:12 AM  Result Value Ref Range   Vitamin B-12 329 180 - 914 pg/mL    Comment: (NOTE) This assay is not validated for testing neonatal or myeloproliferative syndrome specimens for Vitamin B12 levels. Performed at [facility], [address], [place], [place] [address]   Ferritin     Status: None   Collection Time: 10/18/23  9:12 AM  Result Value Ref Range   Ferritin 227 11 - 307 ng/mL    Comment: Performed at [facility], [address] [place], [place]  [address]  Folate     Status: None   Collection Time: 10/18/23  9:30 AM  Result Value Ref Range   Folate  12.7 >5.9 ng/mL    Comment: Performed at [facility], [address], [place], [place] [address]   CT Head Wo Contrast Result Date: 10/18/2023 CLINICAL DATA:  Recent fall with headaches, initial encounter EXAM: CT HEAD WITHOUT CONTRAST TECHNIQUE: Contiguous axial images were obtained from the base of the skull through the vertex without intravenous contrast. RADIATION DOSE REDUCTION: This exam was performed according to the departmental dose-optimization program which includes automated exposure control, adjustment of the mA and/or kV according to patient size and/or use of iterative reconstruction technique. COMPARISON:  None Available. FINDINGS: Brain: No evidence of acute infarction, hemorrhage, hydrocephalus, extra-axial collection or mass lesion/mass effect. Chronic atrophic and ischemic changes are noted. Vascular: No hyperdense vessel or unexpected calcification. Skull: Normal. Negative for fracture or focal lesion. Sinuses/Orbits: No acute finding. Other: None. IMPRESSION: Chronic atrophic and ischemic changes without acute abnormality. Electronically Signed   By: Alcide Clever M.D.   On: 10/18/2023 03:25   DG Lumbar Spine 2-3 Views Result Date: 10/18/2023 CLINICAL DATA:  Recent fall with low back pain, initial encounter EXAM: LUMBAR SPINE - 3 VIEW COMPARISON:  None Available. FINDINGS: Five lumbar type vertebral bodies are well visualized. Vertebral body height is well maintained. Osteophytic changes are noted with mild anterolisthesis of L4 on L5. Facet hypertrophic changes are seen. No soft tissue abnormality is noted. 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 Electronically Signed   By: Alcide Clever M.D.   On: 10/18/2023 03:18   DG HIP UNILAT W OR W/O PELVIS 2-3 VIEWS RIGHT Result Date: 10/18/2023 CLINICAL DATA:  Recent fall with right hip pain, initial encounter EXAM: DG HIP (WITH OR WITHOUT PELVIS) 3V RIGHT COMPARISON:  None Available. FINDINGS: Pelvic  ring is intact. Calcified uterine fibroids are noted. No acute fracture or dislocation is noted. No soft tissue changes are seen. IMPRESSION: No acute abnormality noted. Electronically Signed   By: Alcide Clever M.D.   On: 10/18/2023 03:16    Pending Labs Unresulted Labs (From admission, onward)     Start     Ordered   10/18/23 1355  Iron and TIBC  Add-on,   AD        10/18/23 1354   Pending  CK  Add-on,   R        Pending            Vitals/Pain Today's Vitals   10/18/23 1052 10/18/23 1117 10/18/23 1304 10/18/23 1400  BP:   (!) 153/88 (!) 154/81  Pulse:   70 63  Resp:   19 20  Temp:  98 F (36.7 C)    TempSrc:      SpO2:   99% 100%  Weight: 57.4 kg     Height: 5\' 5"  (1.651 m)     PainSc:        Isolation Precautions No active isolations  Medications Medications  acetaminophen (TYLENOL) tablet 650 mg (has no administration in time range)    Or  acetaminophen (TYLENOL) suppository 650 mg (has no administration in time range)  lactated ringers infusion ( Intravenous New Bag/Given 10/18/23 0624)  fentaNYL (SUBLIMAZE) injection 50 mcg (50 mcg Intravenous Given 10/18/23 0201)  potassium chloride 10 mEq in 100 mL IVPB (0 mEq Intravenous Stopped 10/18/23 0524)  magnesium sulfate IVPB 2 g 50 mL (0 g Intravenous Stopped 10/18/23  4540)  acetaminophen (TYLENOL) tablet 1,000 mg (1,000 mg Oral Given 10/18/23 0519)  potassium chloride SA (KLOR-CON M) CR tablet 20 mEq (20 mEq Oral Given 10/18/23 1116)    Mobility walks

## 2023-10-18 NOTE — H&P (Signed)
 History and Physical    Patient: [name]:[id number] DOB: 1946-04-16 DOA: 10/17/2023 DOS: the patient was seen and examined on 10/18/2023 PCP: [facility] Sim[facility]  Patient coming from: Home  Chief Complaint:  Chief Complaint  Patient presents with   Weakness   HPI: [name] is a 78 y.o. female with medical history significant of type 2 diabetes, hyperlipidemia, hypertension who presented to the emergency department with complaints of right lower extremity pain after having a mechanical fall at home, but stated she has been having arterial RLE discomfort for the past few months with some difficulty ambulating but does not require assistive devices. The patient stated she drinks almost daily. He denied fever, chills, rhinorrhea, sore throat, wheezing or hemoptysis. No chest pain, palpitations, diaphoresis, PND, orthopnea or pitting edema of the lower extremities. No abdominal pain, nausea, emesis, diarrhea, constipation, melena or hematochezia. No flank pain, dysuria, frequency or hematuria.  No polyuria, polydipsia, polyphagia or blurred vision.   Lab work: CBC showed a white count of 6.5, hemoglobin 11.4 g/dL with an MCV of 784.6 fL and platelets 218.  Had more CMP showed a sodium 130, potassium 2.7, chloride 96 and CO2 19 mmol/L with a normal anion gap.  Glucose 205, BUN 55, creatinine 1.97 and corrected calcium 10.2 mg/dL.  LFTs were normal with the exception of an albumin of 3.3 g/dL.  Magnesium was 1.4 and phosphorus 2.8 mg/dL.  Anemia panel was normal.  Imaging: Right hip x-ray with no abnormality seen.  3 view lumbar spine x-rays showing degenerative change with anterolisthesis of L4 on L5.  CT head without contrast showed chronic atrophic and ischemic changes without acute abnormalities.   ED course: Initial vital signs were temperature 97.5 F, pulse 59, respiration 17, BP 154/76 mmHg O2 sat 100% on room air.  The patient received acetaminophen 1000 mg p.o. x 1, fentanyl 50 mcg p.o.  x 1, magnesium sulfate 2 g IVPB, KCl 10 mEq IVPB x 2 and I added KCl 20 mill equivalents p.o. x 1.  Review of Systems: As mentioned in the history of present illness. All other systems reviewed and are negative. Past medical history: As above mentioned.  Social History:  reports current alcohol use. No history on file for tobacco use and drug use.  Allergies  Allergen Reactions   Penicillins     Family medical history: Multiple family members with diabetes and hypertension.  Prior to Admission medications   Medication Sig Start Date End Date Taking? Authorizing Provider  acetaminophen (TYLENOL) 500 MG tablet Take 500 mg by mouth every 6 (six) hours as needed.      [provider]  lisinopril (PRINIVIL,ZESTRIL) 5 MG tablet Take 5 mg by mouth daily.      [provider]  Multiple Vitamin (MULTIVITAMIN) capsule Take 1 capsule by mouth daily.      [provider]  naproxen sodium (ANAPROX) 220 MG tablet Take 220 mg by mouth 2 (two) times daily with a meal.      [provider]  oxyCODONE-acetaminophen (PERCOCET) 5-325 MG per tablet Take 1 tablet by mouth every 4 (four) hours as needed.      [provider]    Physical Exam: Vitals:   10/18/23 0430 10/18/23 0600 10/18/23 0615 10/18/23 0720  BP: (!) 161/83 (!) 142/73 (!) 143/71   Pulse: 62 63 63   Resp: (!) 23 20 19    Temp:    97.7 F (36.5 C)  TempSrc:    Oral  SpO2: 100% 100% 98%    Physical Exam Vitals and nursing note reviewed.  Constitutional:      General: She is awake. She is not in acute distress.    Appearance: Normal appearance. She is ill-appearing.  HENT:     Head: Normocephalic.     Nose: No rhinorrhea.     Mouth/Throat:     Mouth: Mucous membranes are moist.  Eyes:     General: No scleral icterus.    Pupils: Pupils are equal, round, and reactive to light.  Neck:     Vascular: No JVD.  Cardiovascular:     Rate and Rhythm: Normal rate and regular rhythm.     Heart  sounds: S1 normal and S2 normal.  Pulmonary:     Effort: Pulmonary effort is normal.     Breath sounds: Normal breath sounds.  Abdominal:     General: Bowel sounds are normal. There is no distension.     Palpations: Abdomen is soft.     Tenderness: There is no abdominal tenderness. There is no right CVA tenderness or left CVA tenderness.  Musculoskeletal:     Cervical back: Neck supple.     Right lower leg: No edema.     Left lower leg: No edema.  Skin:    General: Skin is warm and dry.  Neurological:     General: No focal deficit present.     Mental Status: She is alert and oriented to person, place, and time.  Psychiatric:        Mood and Affect: Mood normal.        Behavior: Behavior normal. Behavior is cooperative.     Data Reviewed:  Results are pending, will review when available.  Assessment and Plan: Principal Problem:   Metabolic acidosis with respiratory alkalosis  In the setting of:   AKI (acute kidney injury) (HCC) Observation/telemetry. Continue IV fluids. Hold ARB/ACE. Hold diuretic. Avoid hypotension. Avoid nephrotoxins. Monitor intake and output. Monitor renal function/electrolytes.  Active Problems:   Alcohol abuse Not very specific about use. Stated she drinks almost daily. Preemptively ordered CIWA protocol with lorazepam. Folate, MVI and thiamine daily supplementation.    Macrocytic anemia Anemia panel was normal.    Hypomagnesemia EtOH and diuretic use. Magnesium sulfate 2 g IVPB ordered.    Hyponatremia Secondary to diuretic use. Holding diuretic and infusing IVF.    Hypokalemia Replacing. Magnesium was supplemented.    Hypocarbia  Secondary to hyperventilation. Continue AKI treatment.    Mild protein malnutrition (HCC) In the setting of anemia and EtOH use. May benefit from protein supplementation. Consider nutritional services evaluation. Follow-up albumin level.    DDD (degenerative disc disease), lumbar Analgesics and  muscle relaxants as needed.    Hyperlipidemia Check total CK. Hold statin in the meantime. High risk use if drinking daily.    Type 2 diabetes mellitus with hyperglycemia (HCC) Carbohydrate modified diet. Hold metformin. CBG monitoring 4 times daily. Add on RI SS as needed. Check hemoglobin A1c.    Essential hypertension Continue nebivolol 10 mg p.o. daily. Hold ACE inhibitor and ARB. Hold hydrochlorothiazide.     Advance Care Planning:   Code Status: Full Code   Consults:   Family Communication:   Severity of Illness: The appropriate patient status for this patient is OBSERVATION. Observation status is judged to be reasonable and necessary in order to provide the required intensity of service to ensure the patient's safety. The patient's presenting symptoms, physical exam findings, and initial radiographic and laboratory  data in the context of their medical condition is felt to place them at decreased risk for further clinical deterioration. Furthermore, it is anticipated that the patient will be medically stable for discharge from the hospital within 2 midnights of admission.   Author: Bobette Mo, MD 10/18/2023 9:07 AM  For on call review [email or web address].   This document was prepared using Dragon voice recognition software and may contain some unintended transcription errors.

## 2023-10-19 ENCOUNTER — Inpatient Hospital Stay (HOSPITAL_COMMUNITY)

## 2023-10-19 ENCOUNTER — Observation Stay (HOSPITAL_COMMUNITY)

## 2023-10-19 DIAGNOSIS — M4807 Spinal stenosis, lumbosacral region: Secondary | ICD-10-CM | POA: Diagnosis not present

## 2023-10-19 DIAGNOSIS — E871 Hypo-osmolality and hyponatremia: Secondary | ICD-10-CM | POA: Diagnosis not present

## 2023-10-19 DIAGNOSIS — M79604 Pain in right leg: Secondary | ICD-10-CM | POA: Diagnosis not present

## 2023-10-19 DIAGNOSIS — E874 Mixed disorder of acid-base balance: Secondary | ICD-10-CM | POA: Diagnosis not present

## 2023-10-19 DIAGNOSIS — Z6829 Body mass index (BMI) 29.0-29.9, adult: Secondary | ICD-10-CM | POA: Diagnosis not present

## 2023-10-19 DIAGNOSIS — S3992XA Unspecified injury of lower back, initial encounter: Secondary | ICD-10-CM | POA: Diagnosis not present

## 2023-10-19 DIAGNOSIS — M1711 Unilateral primary osteoarthritis, right knee: Secondary | ICD-10-CM | POA: Diagnosis not present

## 2023-10-19 DIAGNOSIS — M25561 Pain in right knee: Secondary | ICD-10-CM | POA: Diagnosis not present

## 2023-10-19 DIAGNOSIS — M48061 Spinal stenosis, lumbar region without neurogenic claudication: Secondary | ICD-10-CM | POA: Diagnosis not present

## 2023-10-19 DIAGNOSIS — N179 Acute kidney failure, unspecified: Secondary | ICD-10-CM | POA: Diagnosis not present

## 2023-10-19 DIAGNOSIS — T502X5A Adverse effect of carbonic-anhydrase inhibitors, benzothiadiazides and other diuretics, initial encounter: Secondary | ICD-10-CM | POA: Diagnosis present

## 2023-10-19 DIAGNOSIS — S83011A Lateral subluxation of right patella, initial encounter: Secondary | ICD-10-CM | POA: Diagnosis not present

## 2023-10-19 DIAGNOSIS — D539 Nutritional anemia, unspecified: Secondary | ICD-10-CM | POA: Diagnosis not present

## 2023-10-19 DIAGNOSIS — E785 Hyperlipidemia, unspecified: Secondary | ICD-10-CM | POA: Diagnosis not present

## 2023-10-19 DIAGNOSIS — Z880 Allergy status to penicillin: Secondary | ICD-10-CM | POA: Diagnosis not present

## 2023-10-19 DIAGNOSIS — Y92009 Unspecified place in unspecified non-institutional (private) residence as the place of occurrence of the external cause: Secondary | ICD-10-CM | POA: Diagnosis not present

## 2023-10-19 DIAGNOSIS — M7989 Other specified soft tissue disorders: Secondary | ICD-10-CM | POA: Diagnosis not present

## 2023-10-19 DIAGNOSIS — W010XXA Fall on same level from slipping, tripping and stumbling without subsequent striking against object, initial encounter: Secondary | ICD-10-CM | POA: Diagnosis present

## 2023-10-19 DIAGNOSIS — M6282 Rhabdomyolysis: Secondary | ICD-10-CM | POA: Diagnosis not present

## 2023-10-19 DIAGNOSIS — M85861 Other specified disorders of bone density and structure, right lower leg: Secondary | ICD-10-CM | POA: Diagnosis not present

## 2023-10-19 DIAGNOSIS — E876 Hypokalemia: Secondary | ICD-10-CM | POA: Diagnosis present

## 2023-10-19 DIAGNOSIS — M51369 Other intervertebral disc degeneration, lumbar region without mention of lumbar back pain or lower extremity pain: Secondary | ICD-10-CM | POA: Diagnosis present

## 2023-10-19 DIAGNOSIS — Z8249 Family history of ischemic heart disease and other diseases of the circulatory system: Secondary | ICD-10-CM | POA: Diagnosis not present

## 2023-10-19 DIAGNOSIS — I10 Essential (primary) hypertension: Secondary | ICD-10-CM | POA: Diagnosis not present

## 2023-10-19 DIAGNOSIS — W19XXXA Unspecified fall, initial encounter: Secondary | ICD-10-CM | POA: Diagnosis present

## 2023-10-19 DIAGNOSIS — E1165 Type 2 diabetes mellitus with hyperglycemia: Secondary | ICD-10-CM | POA: Diagnosis not present

## 2023-10-19 DIAGNOSIS — Z79899 Other long term (current) drug therapy: Secondary | ICD-10-CM | POA: Diagnosis not present

## 2023-10-19 DIAGNOSIS — F1010 Alcohol abuse, uncomplicated: Secondary | ICD-10-CM | POA: Diagnosis present

## 2023-10-19 DIAGNOSIS — R200 Anesthesia of skin: Secondary | ICD-10-CM | POA: Diagnosis present

## 2023-10-19 DIAGNOSIS — M79661 Pain in right lower leg: Secondary | ICD-10-CM | POA: Diagnosis present

## 2023-10-19 DIAGNOSIS — M4316 Spondylolisthesis, lumbar region: Secondary | ICD-10-CM | POA: Diagnosis present

## 2023-10-19 DIAGNOSIS — Z833 Family history of diabetes mellitus: Secondary | ICD-10-CM | POA: Diagnosis not present

## 2023-10-19 DIAGNOSIS — E441 Mild protein-calorie malnutrition: Secondary | ICD-10-CM | POA: Diagnosis not present

## 2023-10-19 LAB — GLUCOSE, CAPILLARY
Glucose-Capillary: 90 mg/dL (ref 70–99)
Glucose-Capillary: 88 mg/dL (ref 70–99)
Glucose-Capillary: 95 mg/dL (ref 70–99)
Glucose-Capillary: 147 mg/dL — ABNORMAL HIGH (ref 70–99)

## 2023-10-19 LAB — HEMOGLOBIN A1C
Hgb A1c MFr Bld: 6.2 % — ABNORMAL HIGH (ref 4.8–5.6)
Mean Plasma Glucose: 131.24 mg/dL

## 2023-10-19 LAB — CK: Total CK: 1432 U/L — ABNORMAL HIGH (ref 38–234)

## 2023-10-19 MED ORDER — THIAMINE HCL 100 MG/ML IJ SOLN
500.0000 mg | Freq: Every day | INTRAVENOUS | Status: AC
  Administered 2023-10-19 – 2023-10-21 (×3): 500 mg via INTRAVENOUS
  Filled 2023-10-19 (×3): qty 5

## 2023-10-19 MED ORDER — THIAMINE MONONITRATE 100 MG PO TABS
100.0000 mg | ORAL_TABLET | Freq: Every day | ORAL | Status: DC
  Administered 2023-10-22 – 2023-10-24 (×3): 100 mg via ORAL
  Filled 2023-10-19 (×3): qty 1

## 2023-10-19 NOTE — Plan of Care (Signed)

## 2023-10-19 NOTE — TOC Initial Note (Signed)
 Transition of Care [facility]) - Initial/Assessment Note    Patient Details  Name: [name] MRN: [id number] Date of Birth: 05/17/1946  Transition of [facility]) CM/SW Contact:    Diona Browner, LCSW Phone Number: 10/19/2023, 1:11 PM  Clinical Narrative:                 Pt recommended for SNF. Pt declines SNF stating she does no want to go to a facility and wants to go home and get [facility]. HHPT/OT setup w/ [facility]. RW ordered via [employer].   Expected Discharge Plan: Home w Home Health Services Barriers to Discharge: Continued Medical Work up   Patient Goals and CMS Choice Patient states their goals for this hospitalization and ongoing recovery are:: return home w/ home health CMS Medicare.gov Compare Post Acute Care list provided to:: Patient Choice offered to / list presented to : Patient [place] ownership interest in [facility].provided to:: Patient    Expected Discharge Plan and Services     Post Acute Care Choice: Durable Medical Equipment Living arrangements for the past 2 months: Single Family Home                 DME Arranged: Walker rolling DME Agency: [employer] Date DME Agency Contacted: 10/19/23 Time DME Agency Contacted: 1310 Representative spoke with at DME Agency: Vaughan Basta HH Arranged: PT, OT HH Agency: [facility] Date [facility] Agency Contacted: 10/19/23 Time HH Agency Contacted: 1311 Representative spoke with at [facility] Agency: Kandee Keen  Prior Living Arrangements/Services Living arrangements for the past 2 months: Single Family Home Lives with:: Self Patient language and need for interpreter reviewed:: Yes Do you feel safe going back to the place where you live?: Yes      Need for Family Participation in Patient Care: Yes (Comment) Care giver support system in place?: Yes (comment)   Criminal Activity/Legal Involvement Pertinent to Current Situation/Hospitalization: No - Comment as needed  Activities of Daily Living   ADL Screening  (condition at time of admission) Independently performs ADLs?: Yes (appropriate for developmental age) Is the patient deaf or have difficulty hearing?: No Does the patient have difficulty seeing, even when wearing glasses/contacts?: No Does the patient have difficulty concentrating, remembering, or making decisions?: Yes  Permission Sought/Granted                  Emotional Assessment Appearance:: Appears stated age Attitude/Demeanor/Rapport: Engaged Affect (typically observed): Accepting Orientation: : Oriented to Self, Oriented to Place, Oriented to  Time, Oriented to Situation Alcohol / Substance Use: Not Applicable Psych Involvement: No (comment)  Admission diagnosis:  Hypokalemia [E87.6] Hypomagnesemia [E83.42] Renal insufficiency [N28.9] AKI (acute kidney injury) (HCC) [N17.9] Patient Active Problem List   Diagnosis Date Noted   AKI (acute kidney injury) (HCC) 10/18/2023   Macrocytic anemia 10/18/2023   Hypomagnesemia 10/18/2023   Hyponatremia 10/18/2023   Hypokalemia 10/18/2023   Hyperglycemia 10/18/2023   Mild protein malnutrition (HCC) 10/18/2023   Hypocarbia 10/18/2023   DDD (degenerative disc disease), lumbar 10/18/2023   Hyperlipidemia 10/18/2023   Type 2 diabetes mellitus with hyperglycemia (HCC) 10/18/2023   Essential hypertension 10/18/2023   Alcohol abuse 10/18/2023   Metabolic acidosis with respiratory alkalosis 10/18/2023   PCP:  [facility] Simmers, [facility] Pharmacy:   [facility] [phone] [place], [place] - [address] [place] [address] Phone: [phone] Fax: [phone]     Social Drivers of Health (SDOH) Social History: SDOH Screenings   Food Insecurity: No  Food Insecurity (10/18/2023)  Housing: Low Risk  (10/18/2023)  Transportation Needs: Unmet Transportation Needs (10/18/2023)  Utilities: Not At Risk (10/18/2023)  Social Connections: Socially Isolated (10/18/2023)  Tobacco Use: Low Risk  (10/18/2023)   SDOH  Interventions:     Readmission Risk Interventions     No data to display

## 2023-10-19 NOTE — Progress Notes (Signed)
 PROGRESS NOTE    [name]  QMV:[id number] DOB: June 09, 1946 DOA: 10/17/2023 PCP: [facility]  Chief Complaint  Patient presents with   Weakness    Brief Narrative:   78 y.o. female with medical history significant of type 2 diabetes, hyperlipidemia, hypertension who presented to the emergency department with complaints of right lower extremity pain after having Ieasha Boerema mechanical fall at home.  Assessment & Plan:   Principal Problem:   AKI (acute kidney injury) (HCC) Active Problems:   Macrocytic anemia   Hypomagnesemia   Hyponatremia   Hypokalemia   Mild protein malnutrition (HCC)   Hypocarbia   DDD (degenerative disc disease), lumbar   Hyperlipidemia   Type 2 diabetes mellitus with hyperglycemia (HCC)   Essential hypertension   Alcohol abuse   Metabolic acidosis with respiratory alkalosis  Mechanical Fall Right Lower Extremity Pain Plain films of hip and lumbar spine unremarkable to this point Head CT with chronic atrophy and ischemic changes - no acute abnormality She notes knee pain today, will get knee films.  Also notes numbness to foot, will get MRI L spine.  Continue PT/OT  Right Foot Numbess Unclear, but new since her fall MRI L spine pending, consider additional imaging Could be compression injury as she fell and had to drag herself - consider outpatient EMG B12 (low normal, follow MMA) and folate wnl  Etoh abuse Daily drinking She's on CIWA  Acute Kidney Injury  Metabolic Acidosis Unclear baseline, improving today - will trend Holding losartan/hydrochlorothiazide  Elevated CK Trend with IVF  Hypertension Unclear what she's taking  Nebivolol  She has indapamide, hydrochlorothiazide, lisinopril, and losartan on med list - also amlodipine Will need to be cleaned up  T2DM Metformin on hold SSI  Dyslipidemia Crestor on hold - not clear if she was taking this?  Med rec with lots of uncertainty - will ask pharmacy to help [place] review  again    DVT prophylaxis: lovenox Code Status: full Family Communication: none Disposition:   Status is: Observation The patient will require care spanning > 2 midnights and should be moved to inpatient because: awaiting improvement in pain and completion of workup   Consultants:  none  Procedures:  none  Antimicrobials:  Anti-infectives (From admission, onward)    None       Subjective: C/o knee pain Also numbness to plantar aspect of foot which is new since fall   Objective: Vitals:   10/19/23 0500 10/19/23 0517 10/19/23 0800 10/19/23 1211  BP:  (!) 153/82 137/79 (!) 141/73  Pulse:  (!) 57  (!) 55  Resp:  17  18  Temp:  98.5 F (36.9 C)  98.5 F (36.9 C)  TempSrc:  Oral  Oral  SpO2:  99% 100% 100%  Weight: 77.4 kg     Height:        Intake/Output Summary (Last 24 hours) at 10/19/2023 1548 Last data filed at 10/19/2023 0900 Gross per 24 hour  Intake 2448.98 ml  Output 725 ml  Net 1723.98 ml   Filed Weights   10/18/23 1052 10/19/23 0500  Weight: 57.4 kg 77.4 kg    Examination:  General exam: Appears calm and comfortable  Respiratory system: unlabored  Cardiovascular system: RRR Gastrointestinal system: Abdomen is nondistended, soft and nontender.  Central nervous system: RLE weakness due to pain, moving all extremities.  Able to dorsi/plantar flex R foot with good strength. Notes numbness to plantar aspect of foot and distal to midfoot.  No saddle anesthesia.  Extremities: no LEE, palpable DP pulses   Data Reviewed: I have personally reviewed following labs and imaging studies  CBC: Recent Labs  Lab 10/18/23 0046  WBC 6.5  NEUTROABS 4.8  HGB 11.4*  HCT 33.7*  MCV 101.5*  PLT 218    Basic Metabolic Panel: Recent Labs  Lab 10/18/23 0046 10/18/23 0912  NA 130* 132*  K 2.7* 3.2*  CL 96* 100  CO2 19* 21*  GLUCOSE 205* 120*  BUN 55* 49*  CREATININE 1.97* 1.64*  CALCIUM 9.6 9.4  MG 1.4*  --   PHOS  --  2.8    GFR: Estimated  Creatinine Clearance: 29.1 mL/min (Tamira Ryland) (by C-G formula based on SCr of 1.64 mg/dL (H)).  Liver Function Tests: Recent Labs  Lab 10/18/23 0046  AST 21  ALT 22  ALKPHOS 79  BILITOT 1.0  PROT 7.5  ALBUMIN 3.3*    CBG: Recent Labs  Lab 10/18/23 2158 10/19/23 0743 10/19/23 1116  GLUCAP 106* 90 88     No results found for this or any previous visit (from the past 240 hours).       Radiology Studies: CT Head Wo Contrast Result Date: 10/18/2023 CLINICAL DATA:  Recent fall with headaches, initial encounter EXAM: CT HEAD WITHOUT CONTRAST TECHNIQUE: Contiguous axial images were obtained from the base of the skull through the vertex without intravenous contrast. RADIATION DOSE REDUCTION: This exam was performed according to the departmental dose-optimization program which includes automated exposure control, adjustment of the mA and/or kV according to patient size and/or use of iterative reconstruction technique. COMPARISON:  None Available. FINDINGS: Brain: No evidence of acute infarction, hemorrhage, hydrocephalus, extra-axial collection or mass lesion/mass effect. Chronic atrophic and ischemic changes are noted. Vascular: No hyperdense vessel or unexpected calcification. Skull: Normal. Negative for fracture or focal lesion. Sinuses/Orbits: No acute finding. Other: None. IMPRESSION: Chronic atrophic and ischemic changes without acute abnormality. Electronically Signed   By: Alcide Clever M.D.   On: 10/18/2023 03:25   DG Lumbar Spine 2-3 Views Result Date: 10/18/2023 CLINICAL DATA:  Recent fall with low back pain, initial encounter EXAM: LUMBAR SPINE - 3 VIEW COMPARISON:  None Available. FINDINGS: Five lumbar type vertebral bodies are well visualized. Vertebral body height is well maintained. Osteophytic changes are noted with mild anterolisthesis of L4 on L5. Facet hypertrophic changes are seen. No soft tissue abnormality is noted. IMPRESSION: Degenerative change with anterolisthesis of L4 on  L5. Electronically Signed   By: Alcide Clever M.D.   On: 10/18/2023 03:18   DG HIP UNILAT W OR W/O PELVIS 2-3 VIEWS RIGHT Result Date: 10/18/2023 CLINICAL DATA:  Recent fall with right hip pain, initial encounter EXAM: DG HIP (WITH OR WITHOUT PELVIS) 3V RIGHT COMPARISON:  None Available. FINDINGS: Pelvic ring is intact. Calcified uterine fibroids are noted. No acute fracture or dislocation is noted. No soft tissue changes are seen. IMPRESSION: No acute abnormality noted. Electronically Signed   By: Alcide Clever M.D.   On: 10/18/2023 03:16        Scheduled Meds:  folic acid  1 mg Oral Daily   LORazepam  0-4 mg Oral Q6H   Followed by   Melene Muller ON 10/20/2023] LORazepam  0-4 mg Oral Q12H   multivitamin with minerals  1 tablet Oral Daily   nebivolol  10 mg Oral Daily   [START ON 10/22/2023] thiamine  100 mg Oral Daily   Continuous Infusions:  lactated ringers 75 mL/hr (10/19/23 0627)   thiamine (VITAMIN B1) injection 500  mg (10/19/23 1206)     LOS: 0 days    Time spent: over 30 min    Lacretia Nicks, MD [facility]   To contact the attending provider between 7A-7P or the covering provider during after hours 7P-7A, please log into the web site [email or web address] and access using universal [place] password for that web site. If you do not have the password, please call the hospital operator.  10/19/2023, 3:48 PM

## 2023-10-19 NOTE — TOC CM/SW Note (Signed)

## 2023-10-19 NOTE — Care Management Obs Status (Signed)
 MEDICARE OBSERVATION STATUS NOTIFICATION   Patient Details  Name: [name] MRN: [id number] Date of Birth: 1945/12/05   Medicare Observation Status Notification Given:  Yes    Diona Browner, LCSW 10/19/2023, 11:32 AM

## 2023-10-20 DIAGNOSIS — N179 Acute kidney failure, unspecified: Secondary | ICD-10-CM | POA: Diagnosis not present

## 2023-10-20 LAB — COMPREHENSIVE METABOLIC PANEL
ALT: 19 U/L (ref 0–44)
AST: 39 U/L (ref 15–41)
Albumin: 2.6 g/dL — ABNORMAL LOW (ref 3.5–5.0)
Alkaline Phosphatase: 66 U/L (ref 38–126)
Anion gap: 9 (ref 5–15)
BUN: 20 mg/dL (ref 8–23)
CO2: 23 mmol/L (ref 22–32)
Calcium: 9.4 mg/dL (ref 8.9–10.3)
Chloride: 103 mmol/L (ref 98–111)
Creatinine, Ser: 0.91 mg/dL (ref 0.44–1.00)
GFR, Estimated: >60 mL/min (ref >60)
Glucose, Bld: 94 mg/dL (ref 70–99)
Potassium: 3.2 mmol/L — ABNORMAL LOW (ref 3.5–5.1)
Sodium: 135 mmol/L (ref 135–145)
Total Bilirubin: 0.8 mg/dL (ref 0.0–1.2)
Total Protein: 6.3 g/dL — ABNORMAL LOW (ref 6.5–8.1)

## 2023-10-20 LAB — CBC WITH DIFFERENTIAL/PLATELET
Abs Immature Granulocytes: 0.01 10*3/uL (ref 0.00–0.07)
Basophils Absolute: 0 10*3/uL (ref 0.0–0.1)
Basophils Relative: 0 %
Eosinophils Absolute: 0.1 10*3/uL (ref 0.0–0.5)
Eosinophils Relative: 1 %
HCT: 33.9 % — ABNORMAL LOW (ref 36.0–46.0)
Hemoglobin: 11 g/dL — ABNORMAL LOW (ref 12.0–15.0)
Immature Granulocytes: 0 %
Lymphocytes Relative: 40 %
Lymphs Abs: 1.8 10*3/uL (ref 0.7–4.0)
MCH: 34 pg (ref 26.0–34.0)
MCHC: 32.4 g/dL (ref 30.0–36.0)
MCV: 104.6 fL — ABNORMAL HIGH (ref 80.0–100.0)
Monocytes Absolute: 0.7 10*3/uL (ref 0.1–1.0)
Monocytes Relative: 15 %
Neutro Abs: 2 10*3/uL (ref 1.7–7.7)
Neutrophils Relative %: 44 %
Platelets: 172 10*3/uL (ref 150–400)
RBC: 3.24 MIL/uL — ABNORMAL LOW (ref 3.87–5.11)
RDW: 12.3 % (ref 11.5–15.5)
WBC: 4.5 10*3/uL (ref 4.0–10.5)
nRBC: 0 % (ref 0.0–0.2)

## 2023-10-20 LAB — GLUCOSE, CAPILLARY
Glucose-Capillary: 86 mg/dL (ref 70–99)
Glucose-Capillary: 121 mg/dL — ABNORMAL HIGH (ref 70–99)
Glucose-Capillary: 97 mg/dL (ref 70–99)
Glucose-Capillary: 117 mg/dL — ABNORMAL HIGH (ref 70–99)

## 2023-10-20 LAB — PHOSPHORUS: Phosphorus: 1.9 mg/dL — ABNORMAL LOW (ref 2.5–4.6)

## 2023-10-20 LAB — MAGNESIUM: Magnesium: 1.3 mg/dL — ABNORMAL LOW (ref 1.7–2.4)

## 2023-10-20 LAB — CK: Total CK: 1416 U/L — ABNORMAL HIGH (ref 38–234)

## 2023-10-20 MED ORDER — ACETAMINOPHEN 500 MG PO TABS
1000.0000 mg | ORAL_TABLET | Freq: Three times a day (TID) | ORAL | Status: DC
  Administered 2023-10-20 – 2023-10-22 (×7): 1000 mg via ORAL
  Filled 2023-10-20 (×9): qty 2

## 2023-10-20 MED ORDER — DICLOFENAC SODIUM 1 % EX GEL
2.0000 g | Freq: Four times a day (QID) | CUTANEOUS | Status: DC
  Filled 2023-10-20: qty 100

## 2023-10-20 MED ORDER — PANTOPRAZOLE SODIUM 40 MG PO TBEC
40.0000 mg | DELAYED_RELEASE_TABLET | Freq: Every day | ORAL | Status: DC
  Administered 2023-10-20 – 2023-10-24 (×5): 40 mg via ORAL
  Filled 2023-10-20 (×5): qty 1

## 2023-10-20 MED ORDER — OXYCODONE HCL 5 MG PO TABS
5.0000 mg | ORAL_TABLET | ORAL | Status: DC | PRN
  Administered 2023-10-20 – 2023-10-22 (×4): 5 mg via ORAL
  Filled 2023-10-20 (×4): qty 1

## 2023-10-20 MED ORDER — K PHOS MONO-SOD PHOS DI & MONO 155-852-130 MG PO TABS
500.0000 mg | ORAL_TABLET | Freq: Four times a day (QID) | ORAL | Status: AC
  Administered 2023-10-20 (×4): 500 mg via ORAL
  Filled 2023-10-20 (×4): qty 2

## 2023-10-20 MED ORDER — MAGNESIUM SULFATE 4 GM/100ML IV SOLN
4.0000 g | Freq: Once | INTRAVENOUS | Status: AC
  Administered 2023-10-20: 4 g via INTRAVENOUS
  Filled 2023-10-20: qty 100

## 2023-10-20 MED ORDER — POTASSIUM CHLORIDE CRYS ER 20 MEQ PO TBCR
40.0000 meq | EXTENDED_RELEASE_TABLET | ORAL | Status: AC
  Administered 2023-10-20 (×2): 40 meq via ORAL
  Filled 2023-10-20 (×2): qty 2

## 2023-10-20 MED ORDER — ACETAMINOPHEN 325 MG PO TABS: 650.0000 mg | ORAL_TABLET | Freq: Four times a day (QID) | ORAL | Status: DC | PRN

## 2023-10-20 MED ORDER — KETOROLAC TROMETHAMINE 15 MG/ML IJ SOLN
15.0000 mg | Freq: Four times a day (QID) | INTRAMUSCULAR | Status: DC
  Administered 2023-10-20 – 2023-10-22 (×8): 15 mg via INTRAVENOUS
  Filled 2023-10-20 (×10): qty 1

## 2023-10-20 NOTE — Progress Notes (Signed)
 PROGRESS NOTE    [name]  ZOX:[id number] DOB: 1946-02-16 DOA: 10/17/2023 PCP: [facility]  Chief Complaint  Patient presents with   Weakness    Brief Narrative:   78 y.o. female with medical history significant of type 2 diabetes, hyperlipidemia, hypertension who presented to the emergency department with complaints of right lower extremity pain after having Paetyn Pietrzak mechanical fall at home.  Assessment & Plan:   Principal Problem:   AKI (acute kidney injury) (HCC) Active Problems:   Macrocytic anemia   Hypomagnesemia   Hyponatremia   Hypokalemia   Mild protein malnutrition (HCC)   Hypocarbia   DDD (degenerative disc disease), lumbar   Hyperlipidemia   Type 2 diabetes mellitus with hyperglycemia (HCC)   Essential hypertension   Alcohol abuse   Metabolic acidosis with respiratory alkalosis   Fall  Mechanical Fall Right Lower Extremity Pain Plain films of hip and lumbar spine unremarkable to this point Head CT with chronic atrophy and ischemic changes - no acute abnormality She notes knee pain today, plain films with degenerative changes of medial and patellofemoral compartments.   Will discuss additional imaging with ortho today, pain management  L spine MRI as below Continue PT/OT  Right Foot Numbess Unclear, but new since her fall MRI L spine with L4-5 grade 1 anterolisthesis with moderate to severe canal stenosis and bilateral subarticular recess setenosis.  L>R foraminal stenosis. L3-4 mild R subarticular recess stenosis and mild L foraminal stenosis.   L5-s1 mild bilateral foraminal stenosis.   Could be compression injury as she fell and had to drag herself - consider outpatient EMG B12 (low normal, follow MMA) and folate wnl Outpatient nsgy and neurology follow up  Etoh abuse Daily drinking She's on CIWA  Acute Kidney Injury  Metabolic Acidosis Unclear baseline, improved to less than 1 today  Elevated CK Trend with IVF  Hypertension Unclear what  she's taking  Nebivolol  She has indapamide, hydrochlorothiazide, lisinopril, and losartan on med list - also amlodipine Will need to be cleaned up  Hypokalemia Replace and follow  Hypophosphatemia Replace and follow  Hypomagnesemia Replace and follow   T2DM Metformin on hold SSI  Dyslipidemia Crestor on hold - not clear if she was taking this?  Med rec with lots of uncertainty - will ask pharmacy to help [place] review again - needs clean up of home meds    DVT prophylaxis: lovenox Code Status: full Family Communication: none Disposition:   Status is: Observation The patient will require care spanning > 2 midnights and should be moved to inpatient because: awaiting improvement in pain and completion of workup   Consultants:  none  Procedures:  none  Antimicrobials:  Anti-infectives (From admission, onward)    None       Subjective: Continued knee pain  Objective: Vitals:   10/19/23 1929 10/20/23 0047 10/20/23 0459 10/20/23 0600  BP: (!) 160/100 (!) 151/72 (!) 150/61   Pulse: (!) 58 (!) 53 (!) 54   Resp: 18 18 16    Temp: 98.2 F (36.8 C) 98.3 F (36.8 C) 98.1 F (36.7 C)   TempSrc:      SpO2: 100% 100% 100%   Weight:    76 kg  Height:        Intake/Output Summary (Last 24 hours) at 10/20/2023 1117 Last data filed at 10/20/2023 0742 Gross per 24 hour  Intake 580 ml  Output 1050 ml  Net -470 ml   Filed Weights   10/18/23 1052 10/19/23 0500 10/20/23 0600  Weight: 57.4 kg 77.4 kg 76 kg    Examination:  General: No acute distress. Cardiovascular: RRR Lungs: unlabored Neurological: Alert and oriented 3. Moves all extremities 4 with equal strength. Cranial nerves II through XII grossly intact. Extremities: mild R knee swelling, ttp (seems more tender than yesterday, but she's also frustrated)  Data Reviewed: I have personally reviewed following labs and imaging studies  CBC: Recent Labs  Lab 10/18/23 0046 10/20/23 0543  WBC 6.5 4.5   NEUTROABS 4.8 2.0  HGB 11.4* 11.0*  HCT 33.7* 33.9*  MCV 101.5* 104.6*  PLT 218 172    Basic Metabolic Panel: Recent Labs  Lab 10/18/23 0046 10/18/23 0912 10/20/23 0543  NA 130* 132* 135  K 2.7* 3.2* 3.2*  CL 96* 100 103  CO2 19* 21* 23  GLUCOSE 205* 120* 94  BUN 55* 49* 20  CREATININE 1.97* 1.64* 0.91  CALCIUM 9.6 9.4 9.4  MG 1.4*  --  1.3*  PHOS  --  2.8 1.9*    GFR: Estimated Creatinine Clearance: 52 mL/min (by C-G formula based on SCr of 0.91 mg/dL).  Liver Function Tests: Recent Labs  Lab 10/18/23 0046 10/20/23 0543  AST 21 39  ALT 22 19  ALKPHOS 79 66  BILITOT 1.0 0.8  PROT 7.5 6.3*  ALBUMIN 3.3* 2.6*    CBG: Recent Labs  Lab 10/19/23 0743 10/19/23 1116 10/19/23 1638 10/19/23 2045 10/20/23 0735  GLUCAP 90 88 95 147* 86     No results found for this or any previous visit (from the past 240 hours).       Radiology Studies: MR LUMBAR SPINE WO CONTRAST Result Date: 10/20/2023 CLINICAL DATA:  Low back pain, trauma right lower extremity pain, numbness EXAM: MRI LUMBAR SPINE WITHOUT CONTRAST TECHNIQUE: Multiplanar, multisequence MR imaging of the lumbar spine was performed. No intravenous contrast was administered. COMPARISON:  Lumbar radiographs October 18, 2023. FINDINGS: Segmentation:  Standard. Alignment: Grade 1 anterolisthesis of L4 on L5. Vertebrae:  No fracture, evidence of discitis, or bone lesion. Conus medullaris and cauda equina: Conus extends to the T12-L1 level. Conus and cauda equina appear normal. Paraspinal and other soft tissues: Partially imaged left renal cysts. Disc levels: T12-L1: No significant disc protrusion, foraminal stenosis, or canal stenosis. L1-L2: No significant disc protrusion, foraminal stenosis, or canal stenosis. L2-L3: Disc bulge and facet arthropathy. No significant canal or foraminal stenosis. L3-L4: Disc bulging and bilateral facet arthropathy. Ligamentum flavum thickening. Resulting mild right subarticular recess  stenosis and mild left foraminal stenosis. L4-L5: Grade 1 anterolisthesis of L4 on L5. Uncovering the disc and superimposed disc bulging. Resulting moderate to severe canal stenosis with bilateral subarticular recess stenosis. Moderate left greater than right foraminal stenosis. L5-S1: Mild disc bulge. Bilateral facet arthropathy appears [place] mild bilateral foraminal stenosis. Patent canal. IMPRESSION: 1. At L4-L5, grade 1 anterolisthesis with moderate to severe canal stenosis and bilateral subarticular recess stenosis. Moderate left greater than right foraminal stenosis. 2. At L3-L4, mild right subarticular recess stenosis and mild left foraminal stenosis. 3. At L5-S1, mild bilateral foraminal stenosis. Electronically Signed   By: Feliberto Harts M.D.   On: 10/20/2023 00:50   DG Knee Complete 4 Views Right Result Date: 10/19/2023 CLINICAL DATA:  144615 Pain 144615 EXAM: RIGHT KNEE - COMPLETE 4+ VIEW COMPARISON:  None Available. FINDINGS: Osteopenia. No acute fracture or dislocation. Osseous excrescence along the medial femoral condyle most consistent with sequela of remote prior MCL injury. Mild joint space narrowing of the medial compartment with osteophyte formation of  the medial compartment and patellofemoral compartment. No area of erosion or osseous destruction. No unexpected radiopaque foreign body. Atherosclerotic calcifications. IMPRESSION: Mild degenerative changes of the medial and patellofemoral compartments. Electronically Signed   By: Meda Klinefelter M.D.   On: 10/19/2023 15:58        Scheduled Meds:  acetaminophen  1,000 mg Oral Q8H   folic acid  1 mg Oral Daily   ketorolac  15 mg Intravenous Q6H   LORazepam  0-4 mg Oral Q6H   Followed by   LORazepam  0-4 mg Oral Q12H   multivitamin with minerals  1 tablet Oral Daily   nebivolol  10 mg Oral Daily   pantoprazole  40 mg Oral Daily   phosphorus  500 mg Oral QID   potassium chloride  40 mEq Oral Q4H   [START ON 10/22/2023]  thiamine  100 mg Oral Daily   Continuous Infusions:  lactated ringers 75 mL/hr at 10/19/23 2218   thiamine (VITAMIN B1) injection 500 mg (10/20/23 1050)     LOS: 1 day    Time spent: over 30 min    Lacretia Nicks, MD [facility]   To contact the attending provider between 7A-7P or the covering provider during after hours 7P-7A, please log into the web site [email or web address] and access using universal [place] password for that web site. If you do not have the password, please call the hospital operator.  10/20/2023, 11:17 AM

## 2023-10-20 NOTE — Plan of Care (Signed)

## 2023-10-20 NOTE — Plan of Care (Signed)
  Problem: Pain Managment: Goal: General experience of comfort will improve and/or be controlled Outcome: Progressing   Problem: Safety: Goal: Ability to remain free from injury will improve Outcome: Progressing   Problem: Skin Integrity: Goal: Risk for impaired skin integrity will decrease Outcome: Progressing   Problem: Metabolic: Goal: Ability to maintain appropriate glucose levels will improve Outcome: Progressing   Problem: Skin Integrity: Goal: Risk for impaired skin integrity will decrease Outcome: Progressing

## 2023-10-21 ENCOUNTER — Inpatient Hospital Stay (HOSPITAL_COMMUNITY)

## 2023-10-21 DIAGNOSIS — N179 Acute kidney failure, unspecified: Secondary | ICD-10-CM | POA: Diagnosis not present

## 2023-10-21 LAB — CBC
HCT: 31.6 % — ABNORMAL LOW (ref 36.0–46.0)
Hemoglobin: 10.2 g/dL — ABNORMAL LOW (ref 12.0–15.0)
MCH: 33.6 pg (ref 26.0–34.0)
MCHC: 32.3 g/dL (ref 30.0–36.0)
MCV: 103.9 fL — ABNORMAL HIGH (ref 80.0–100.0)
Platelets: 169 10*3/uL (ref 150–400)
RBC: 3.04 MIL/uL — ABNORMAL LOW (ref 3.87–5.11)
RDW: 12.4 % (ref 11.5–15.5)
WBC: 5 10*3/uL (ref 4.0–10.5)
nRBC: 0 % (ref 0.0–0.2)

## 2023-10-21 LAB — COMPREHENSIVE METABOLIC PANEL
ALT: 19 U/L (ref 0–44)
AST: 37 U/L (ref 15–41)
Albumin: 2.4 g/dL — ABNORMAL LOW (ref 3.5–5.0)
Alkaline Phosphatase: 62 U/L (ref 38–126)
Anion gap: 7 (ref 5–15)
BUN: 18 mg/dL (ref 8–23)
CO2: 24 mmol/L (ref 22–32)
Calcium: 8.6 mg/dL — ABNORMAL LOW (ref 8.9–10.3)
Chloride: 104 mmol/L (ref 98–111)
Creatinine, Ser: 1.12 mg/dL — ABNORMAL HIGH (ref 0.44–1.00)
GFR, Estimated: 50 mL/min — ABNORMAL LOW (ref >60)
Glucose, Bld: 101 mg/dL — ABNORMAL HIGH (ref 70–99)
Potassium: 4.1 mmol/L (ref 3.5–5.1)
Sodium: 135 mmol/L (ref 135–145)
Total Bilirubin: 0.6 mg/dL (ref 0.0–1.2)
Total Protein: 5.9 g/dL — ABNORMAL LOW (ref 6.5–8.1)

## 2023-10-21 LAB — GLUCOSE, CAPILLARY
Glucose-Capillary: 96 mg/dL (ref 70–99)
Glucose-Capillary: 151 mg/dL — ABNORMAL HIGH (ref 70–99)
Glucose-Capillary: 91 mg/dL (ref 70–99)
Glucose-Capillary: 75 mg/dL (ref 70–99)

## 2023-10-21 LAB — MAGNESIUM: Magnesium: 1.7 mg/dL (ref 1.7–2.4)

## 2023-10-21 LAB — CK: Total CK: 1199 U/L — ABNORMAL HIGH (ref 38–234)

## 2023-10-21 LAB — PHOSPHORUS: Phosphorus: 3.9 mg/dL (ref 2.5–4.6)

## 2023-10-21 MED ORDER — LOSARTAN POTASSIUM 50 MG PO TABS
25.0000 mg | ORAL_TABLET | Freq: Every day | ORAL | Status: DC
  Administered 2023-10-21 – 2023-10-22 (×2): 25 mg via ORAL
  Filled 2023-10-21 (×2): qty 1

## 2023-10-21 NOTE — Plan of Care (Signed)
   Problem: Nutrition: Goal: Adequate nutrition will be maintained Outcome: Progressing   Problem: Skin Integrity: Goal: Risk for impaired skin integrity will decrease Outcome: Progressing   Problem: Safety: Goal: Ability to remain free from injury will improve Outcome: Progressing

## 2023-10-21 NOTE — Plan of Care (Signed)

## 2023-10-21 NOTE — Progress Notes (Addendum)
 PROGRESS NOTE    [name]  WUJ:[id number] DOB: [birth year] DOA: 10/17/2023 PCP: Renaye Rakers, MD  Chief Complaint  Patient presents with   Weakness    Brief Narrative:   78 y.o. female with medical history significant of type 2 diabetes, hyperlipidemia, hypertension who presented to the emergency department with complaints of right lower extremity pain after having Manjot Beumer mechanical fall at home.  Assessment & Plan:   Principal Problem:   AKI (acute kidney injury) (HCC) Active Problems:   Macrocytic anemia   Hypomagnesemia   Hyponatremia   Hypokalemia   Mild protein malnutrition (HCC)   Hypocarbia   DDD (degenerative disc disease), lumbar   Hyperlipidemia   Type 2 diabetes mellitus with hyperglycemia (HCC)   Essential hypertension   Alcohol abuse   Metabolic acidosis with respiratory alkalosis   Fall  Mechanical Fall Right Lower Extremity Pain Plain films of hip and lumbar spine unremarkable to this point Head CT with chronic atrophy and ischemic changes - no acute abnormality She notes knee pain today, plain films with degenerative changes of medial and patellofemoral compartments.   CT R knee pending L spine MRI as below Continue PT/OT -> recommending SNF  Right Foot Numbess Unclear, but new since her fall - improved today MRI L spine with L4-5 grade 1 anterolisthesis with moderate to severe canal stenosis and bilateral subarticular recess setenosis.  L>R foraminal stenosis. L3-4 mild R subarticular recess stenosis and mild L foraminal stenosis.   L5-s1 mild bilateral foraminal stenosis.   Could be compression injury as she fell and had to drag herself - consider outpatient EMG B12 (low normal, follow MMA) and folate wnl Outpatient nsgy and neurology follow up  Etoh abuse Daily drinking She's on CIWA  Memory Concerns Noted from family Will plan for neurology follow up outpatient B12 low normal as above TSH  Acute Kidney Injury  Metabolic Acidosis Unclear  baseline, improved to around 1  Elevated CK Gradual improvement Trend with IVF  Hypertension Unclear what she's taking  Nebivolol, will start losartan She has indapamide, hydrochlorothiazide, lisinopril, and losartan on med list - also amlodipine Will need to be cleaned up  Hypokalemia Replace and follow  Hypophosphatemia Replace and follow  Hypomagnesemia Replace and follow   T2DM Metformin on hold SSI  Dyslipidemia Crestor on hold - not clear if she was taking this?  Med rec with lots of uncertainty - will ask pharmacy to help [place] review again - needs clean up of home meds    DVT prophylaxis: lovenox Code Status: full Family Communication: none Disposition:   Status is: Observation The patient will require care spanning > 2 midnights and should be moved to inpatient because: awaiting improvement in pain and completion of workup   Consultants:  none  Procedures:  none  Antimicrobials:  Anti-infectives (From admission, onward)    None       Subjective: Numbness better Knee pain better  Objective: Vitals:   10/20/23 1201 10/20/23 1300 10/20/23 1921 10/21/23 0453  BP: (!) 148/77  (!) 155/80 (!) 157/76  Pulse: 62  (!) 57 61  Resp: 18  20 19   Temp:  98 F (36.7 C) 98.5 F (36.9 C) 98.4 F (36.9 C)  TempSrc:      SpO2: 100%  99% 99%  Weight:    82.1 kg  Height:        Intake/Output Summary (Last 24 hours) at 10/21/2023 1027 Last data filed at 10/21/2023 0527 Gross per 24 hour  Intake  220 ml  Output 600 ml  Net -380 ml   Filed Weights   10/19/23 0500 10/20/23 0600 10/21/23 0453  Weight: 77.4 kg 76 kg 82.1 kg    Examination:  General: No acute distress. Sitting up in chair. Cardiovascular: RRR Lungs: unlabored Neurological: Alert and oriented 3. Moves all extremities 4 with equal strength. Cranial nerves II through XII grossly intact. Skin: Warm and dry. No rashes or lesions. Extremities: mild R knee TTP (improved from  yesterday)  Data Reviewed: I have personally reviewed following labs and imaging studies  CBC: Recent Labs  Lab 10/18/23 0046 10/20/23 0543 10/21/23 0458  WBC 6.5 4.5 5.0  NEUTROABS 4.8 2.0  --   HGB 11.4* 11.0* 10.2*  HCT 33.7* 33.9* 31.6*  MCV 101.5* 104.6* 103.9*  PLT 218 172 169    Basic Metabolic Panel: Recent Labs  Lab 10/18/23 0046 10/18/23 0912 10/20/23 0543 10/21/23 0501  NA 130* 132* 135 135  K 2.7* 3.2* 3.2* 4.1  CL 96* 100 103 104  CO2 19* 21* 23 24  GLUCOSE 205* 120* 94 101*  BUN 55* 49* 20 18  CREATININE 1.97* 1.64* 0.91 1.12*  CALCIUM 9.6 9.4 9.4 8.6*  MG 1.4*  --  1.3* 1.7  PHOS  --  2.8 1.9* 3.9    GFR: Estimated Creatinine Clearance: 43.8 mL/min (Zacary Bauer) (by C-G formula based on SCr of 1.12 mg/dL (H)).  Liver Function Tests: Recent Labs  Lab 10/18/23 0046 10/20/23 0543 10/21/23 0501  AST 21 39 37  ALT 22 19 19   ALKPHOS 79 66 62  BILITOT 1.0 0.8 0.6  PROT 7.5 6.3* 5.9*  ALBUMIN 3.3* 2.6* 2.4*    CBG: Recent Labs  Lab 10/20/23 0735 10/20/23 1200 10/20/23 1700 10/20/23 2036 10/21/23 0735  GLUCAP 86 121* 97 117* 96     No results found for this or any previous visit (from the past 240 hours).       Radiology Studies: MR LUMBAR SPINE WO CONTRAST Result Date: 10/20/2023 CLINICAL DATA:  Low back pain, trauma right lower extremity pain, numbness EXAM: MRI LUMBAR SPINE WITHOUT CONTRAST TECHNIQUE: Multiplanar, multisequence MR imaging of the lumbar spine was performed. No intravenous contrast was administered. COMPARISON:  Lumbar radiographs October 18, 2023. FINDINGS: Segmentation:  Standard. Alignment: Grade 1 anterolisthesis of L4 on L5. Vertebrae:  No fracture, evidence of discitis, or bone lesion. Conus medullaris and cauda equina: Conus extends to the T12-L1 level. Conus and cauda equina appear normal. Paraspinal and other soft tissues: Partially imaged left renal cysts. Disc levels: T12-L1: No significant disc protrusion, foraminal  stenosis, or canal stenosis. L1-L2: No significant disc protrusion, foraminal stenosis, or canal stenosis. L2-L3: Disc bulge and facet arthropathy. No significant canal or foraminal stenosis. L3-L4: Disc bulging and bilateral facet arthropathy. Ligamentum flavum thickening. Resulting mild right subarticular recess stenosis and mild left foraminal stenosis. L4-L5: Grade 1 anterolisthesis of L4 on L5. Uncovering the disc and superimposed disc bulging. Resulting moderate to severe canal stenosis with bilateral subarticular recess stenosis. Moderate left greater than right foraminal stenosis. L5-S1: Mild disc bulge. Bilateral facet arthropathy appears [place] mild bilateral foraminal stenosis. Patent canal. IMPRESSION: 1. At L4-L5, grade 1 anterolisthesis with moderate to severe canal stenosis and bilateral subarticular recess stenosis. Moderate left greater than right foraminal stenosis. 2. At L3-L4, mild right subarticular recess stenosis and mild left foraminal stenosis. 3. At L5-S1, mild bilateral foraminal stenosis. Electronically Signed   By: Feliberto Harts M.D.   On: 10/20/2023 00:50   DG  Knee Complete 4 Views Right Result Date: 10/19/2023 CLINICAL DATA:  144615 Pain 144615 EXAM: RIGHT KNEE - COMPLETE 4+ VIEW COMPARISON:  None Available. FINDINGS: Osteopenia. No acute fracture or dislocation. Osseous excrescence along the medial femoral condyle most consistent with sequela of remote prior MCL injury. Mild joint space narrowing of the medial compartment with osteophyte formation of the medial compartment and patellofemoral compartment. No area of erosion or osseous destruction. No unexpected radiopaque foreign body. Atherosclerotic calcifications. IMPRESSION: Mild degenerative changes of the medial and patellofemoral compartments. Electronically Signed   By: Meda Klinefelter M.D.   On: 10/19/2023 15:58        Scheduled Meds:  acetaminophen  1,000 mg Oral Q8H   folic acid  1 mg Oral Daily   ketorolac   15 mg Intravenous Q6H   LORazepam  0-4 mg Oral Q12H   multivitamin with minerals  1 tablet Oral Daily   nebivolol  10 mg Oral Daily   pantoprazole  40 mg Oral Daily   [START ON 10/22/2023] thiamine  100 mg Oral Daily   Continuous Infusions:  lactated ringers 75 mL/hr at 10/20/23 2331     LOS: 2 days    Time spent: over 30 min    Lacretia Nicks, MD [facility]   To contact the attending provider between 7A-7P or the covering provider during after hours 7P-7A, please log into the web site [email or web address] and access using universal [place] password for that web site. If you do not have the password, please call the hospital operator.  10/21/2023, 10:27 AM

## 2023-10-21 NOTE — Progress Notes (Signed)
 Occupational Therapy Treatment Patient Details Name: [name] MRN: [id number] DOB: 22-Apr-1946 Today's Date: 10/21/2023   History of present illness 78 yo female presented to ED on 10/17/2023 due to mechanical fall in home setting, and R LE weakness, AKI and abn labs. 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 Imaging of R hip and back negative for acute findings. PMH unremarkable.   OT comments  Pt making steady progress toward goals. Use of RW for amb transfers to perform toilet and recliner transfers as well as standing tolerance and balance for toileting hygiene and LE dressing. See below for status and current therex for sit to stand progression. Pt's family arrived bedside and patient in agreement for TTB and HHOT recommendations and needs for family support once discharged home. OT will continue to follow in Acute setting to continue to progress toward goals.       If plan is discharge home, recommend the following:  Assist for transportation;Assistance with cooking/housework;A little help with walking and/or transfers;A little help with bathing/dressing/bathroom   Equipment Recommendations  Tub/shower seat       Precautions / Restrictions Precautions Precautions: Fall Recall of Precautions/Restrictions: Intact Precaution/Restrictions Comments: compalins of R leg weakness Restrictions Weight Bearing Restrictions Per Provider Order: No       Mobility Bed Mobility Overal bed mobility: Needs Assistance Bed Mobility: Supine to Sit, Sit to Supine     Supine to sit: Supervision Sit to supine: Supervision   General bed mobility comments: min cues and increased time with increased time and cues for IND    Transfers Overall transfer level: Needs assistance Equipment used: Rolling walker (2 wheels) Transfers: Sit to/from Stand, Bed to chair/wheelchair/BSC Sit to Stand: Contact guard assist     Step pivot transfers: Contact guard assist     General transfer comment:  improved standing tolerance for light ADL's with RW and 8 steps amb from commode set against wall from bed then to recliner     Balance Overall balance assessment: Needs assistance Sitting-balance support: Feet supported Sitting balance-Leahy Scale: Good     Standing balance support: Reliant on assistive device for balance, Bilateral upper extremity supported, During functional activity Standing balance-Leahy Scale: Fair                             ADL either performed or assessed with clinical judgement   ADL Overall ADL's : Needs assistance/impaired Eating/Feeding: Independent   Grooming: Wash/dry hands;Wash/dry face;Oral care;Applying deodorant;Supervision/safety;Sitting;Standing               Lower Body Dressing: Contact guard assist;Sitting/lateral leans;Sit to/from stand   Toilet Transfer: Contact guard assist;Rolling walker (2 wheels);BSC/3in1;Ambulation;Cueing for sequencing   Toileting- Clothing Manipulation and Hygiene: Contact guard assist;Sit to/from stand;Sitting/lateral lean       Functional mobility during ADLs: Contact guard assist;Rolling walker (2 wheels) General ADL Comments: pt now with new Rw in room and set to proper height used for UE support for amb transfer and standing support for LE self care and peri hygeine post toileting    Extremity/Trunk Assessment Upper Extremity Assessment Upper Extremity Assessment: Generalized weakness   Lower Extremity Assessment Lower Extremity Assessment: Generalized weakness                 Communication Communication Communication: No apparent difficulties   Cognition Arousal: Alert Behavior During Therapy: WFL for tasks assessed/performed Cognition: No apparent impairments  Following commands: Intact        Cueing   Cueing Techniques: Verbal cues        General Comments no skin issues    Pertinent Vitals/ Pain       Pain Assessment Pain  Assessment: 0-10 Pain Score: 2  Pain Location: R LE Pain Descriptors / Indicators: Aching Pain Intervention(s): Limited activity within patient's tolerance, Monitored during session, Repositioned, Relaxation   Frequency  Min 1X/week        Progress Toward Goals  OT Goals(current goals can now be found in the care plan section)  Progress towards OT goals: Progressing toward goals  Acute Rehab OT Goals Patient Stated Goal: to get stronger OT Goal Formulation: With patient Time For Goal Achievement: 11/01/23 Potential to Achieve Goals: Good   AM-PAC OT "6 Clicks" Daily Activity     Outcome Measure   Help from another person eating meals?: None Help from another person taking care of personal grooming?: None Help from another person toileting, which includes using toliet, bedpan, or urinal?: A Little Help from another person bathing (including washing, rinsing, drying)?: A Little Help from another person to put on and taking off regular upper body clothing?: None Help from another person to put on and taking off regular lower body clothing?: A Little 6 Click Score: 21    End of Session Equipment Utilized During Treatment: Gait belt;Rolling walker (2 wheels)  OT Visit Diagnosis: Unsteadiness on feet (R26.81);Muscle weakness (generalized) (M62.81)   Activity Tolerance Patient tolerated treatment well   Patient Left in chair;with call bell/phone within reach;with chair alarm set   Nurse Communication Mobility status        Time: 0981-1914 OT Time Calculation (min): 43 min  Charges: OT General Charges $OT Visit: 1 Visit OT Treatments $Self Care/Home Management : 38-52 mins  Antionne Enrique OT/L Acute Rehabilitation Department  [phone]  10/21/2023, 9:57 AM

## 2023-10-21 NOTE — Progress Notes (Signed)
 PT Cancellation Note  Patient Details Name: [name] MRN: [id number] DOB: 1946/01/09   Cancelled Treatment:     Pt just got back from "downstairs" knee CT and requesting to rest.  Also awaiting VAS [place] LE extremity.  Pt has been evaluated with rec to return home with [facility].  Felecia Shelling  PTA Acute  Rehabilitation Services Office M-F          [phone]

## 2023-10-22 ENCOUNTER — Inpatient Hospital Stay (HOSPITAL_COMMUNITY)

## 2023-10-22 DIAGNOSIS — N179 Acute kidney failure, unspecified: Secondary | ICD-10-CM | POA: Diagnosis not present

## 2023-10-22 DIAGNOSIS — M7989 Other specified soft tissue disorders: Secondary | ICD-10-CM | POA: Diagnosis not present

## 2023-10-22 LAB — GLUCOSE, CAPILLARY
Glucose-Capillary: 86 mg/dL (ref 70–99)
Glucose-Capillary: 113 mg/dL — ABNORMAL HIGH (ref 70–99)
Glucose-Capillary: 89 mg/dL (ref 70–99)
Glucose-Capillary: 85 mg/dL (ref 70–99)

## 2023-10-22 LAB — CBC
HCT: 31.2 % — ABNORMAL LOW (ref 36.0–46.0)
Hemoglobin: 9.8 g/dL — ABNORMAL LOW (ref 12.0–15.0)
MCH: 33.7 pg (ref 26.0–34.0)
MCHC: 31.4 g/dL (ref 30.0–36.0)
MCV: 107.2 fL — ABNORMAL HIGH (ref 80.0–100.0)
Platelets: 167 10*3/uL (ref 150–400)
RBC: 2.91 MIL/uL — ABNORMAL LOW (ref 3.87–5.11)
RDW: 12.4 % (ref 11.5–15.5)
WBC: 5 10*3/uL (ref 4.0–10.5)
nRBC: 0 % (ref 0.0–0.2)

## 2023-10-22 LAB — TSH: TSH: 1.557 u[IU]/mL (ref 0.350–4.500)

## 2023-10-22 LAB — BASIC METABOLIC PANEL
Anion gap: 7 (ref 5–15)
BUN: 17 mg/dL (ref 8–23)
CO2: 23 mmol/L (ref 22–32)
Calcium: 8.5 mg/dL — ABNORMAL LOW (ref 8.9–10.3)
Chloride: 102 mmol/L (ref 98–111)
Creatinine, Ser: 1.11 mg/dL — ABNORMAL HIGH (ref 0.44–1.00)
GFR, Estimated: 51 mL/min — ABNORMAL LOW (ref >60)
Glucose, Bld: 82 mg/dL (ref 70–99)
Potassium: 3.8 mmol/L (ref 3.5–5.1)
Sodium: 132 mmol/L — ABNORMAL LOW (ref 135–145)

## 2023-10-22 LAB — PHOSPHORUS: Phosphorus: 2.9 mg/dL (ref 2.5–4.6)

## 2023-10-22 LAB — METHYLMALONIC ACID, SERUM: Methylmalonic Acid, Quantitative: 162 nmol/L (ref 0–378)

## 2023-10-22 LAB — MAGNESIUM: Magnesium: 1.4 mg/dL — ABNORMAL LOW (ref 1.7–2.4)

## 2023-10-22 MED ORDER — MAGNESIUM SULFATE 4 GM/100ML IV SOLN
4.0000 g | Freq: Once | INTRAVENOUS | Status: AC
  Administered 2023-10-22: 4 g via INTRAVENOUS
  Filled 2023-10-22: qty 100

## 2023-10-22 MED ORDER — AMLODIPINE BESYLATE 10 MG PO TABS
10.0000 mg | ORAL_TABLET | Freq: Every day | ORAL | Status: DC
  Administered 2023-10-22 – 2023-10-23 (×2): 10 mg via ORAL
  Filled 2023-10-22 (×2): qty 1

## 2023-10-22 NOTE — Progress Notes (Signed)
 PROGRESS NOTE    [name]  GEX:[id number] DOB: Mar 07, 1946 DOA: 10/17/2023 PCP: Renaye Rakers, MD  Chief Complaint  Patient presents with   Weakness    Brief Narrative:   78 y.o. female with medical history significant of type 2 diabetes, hyperlipidemia, hypertension who presented to the emergency department with complaints of right lower extremity pain after having Fantashia Shupert mechanical fall at home.  Stable for discharge to SNF when bed available.  Continued R knee pain, but this seems to be gradually improving.   Assessment & Plan:   Principal Problem:   AKI (acute kidney injury) (HCC) Active Problems:   Macrocytic anemia   Hypomagnesemia   Hyponatremia   Hypokalemia   Mild protein malnutrition (HCC)   Hypocarbia   DDD (degenerative disc disease), lumbar   Hyperlipidemia   Type 2 diabetes mellitus with hyperglycemia (HCC)   Essential hypertension   Alcohol abuse   Metabolic acidosis with respiratory alkalosis   Fall  Mechanical Fall Right Lower Extremity Pain Plain films of hip and lumbar spine unremarkable to this point Head CT with chronic atrophy and ischemic changes - no acute abnormality She notes knee pain today, plain films with degenerative changes of medial and patellofemoral compartments.   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  L>R foraminal stenosis. L3-4 mild R subarticular recess stenosis and mild L foraminal stenosis.   L5-s1 mild bilateral foraminal stenosis.   Could be compression injury as she fell and had to drag herself - consider outpatient EMG if persistent B12 (low normal, follow MMA) and folate wnl Outpatient nsgy and neurology  follow up  Etoh abuse Daily drinking She's on CIWA  Memory Concerns Noted from family Will plan for neurology follow up outpatient B12 low normal as above TSH  Acute Kidney Injury  Metabolic Acidosis Unclear baseline, improved to around 1  Elevated CK Gradual improvement Trend with IVF  Hypertension Med rec updated today Nebivolol, will order amlodipine Hold indapamide for now D/c losartan as this wasn't [facility] home med  Hypokalemia Replace and follow  Hypophosphatemia Replace and follow  Hypomagnesemia Replace and follow   T2DM Metformin on hold SSI  Dyslipidemia Not taking Isa Kohlenberg statin per med rec  Appreciate pharmacy for med rec    DVT prophylaxis: lovenox Code Status: full Family Communication: none Disposition:   Status is: Observation The patient will require care spanning > 2 midnights and should be moved to inpatient because: awaiting improvement in pain and completion of workup   Consultants:  none  Procedures:  none  Antimicrobials:  Anti-infectives (From admission, onward)    None       Subjective: No new complaints Wants to go home (I discussed I thought she'd need more help than we'd be able to provide for her)  Objective: Vitals:   10/22/23 0500 10/22/23 0538 10/22/23 0600 10/22/23 1247  BP:  (!) 165/74 (!) 158/77 (!) 158/82  Pulse:  62 (!) 59 (!) 55  Resp:  18    Temp:  97.9 F (36.6 C)  98.3 F (36.8 C)  TempSrc:  Oral  Oral  SpO2:  100% 100% 98%  Weight: 84.2 kg     Height:  Intake/Output Summary (Last 24 hours) at 10/22/2023 1640 Last data filed at 10/22/2023 0900 Gross per 24 hour  Intake 720 ml  Output 0 ml  Net 720 ml   Filed Weights   10/20/23 0600 10/21/23 0453 10/22/23 0500  Weight: 76 kg 82.1 kg 84.2 kg    Examination:  General: No acute distress. Cardiovascular: RRR Lungs: unlabored Abdomen: Soft, nontender, nondistended  Neurological: Alert and oriented 3. Moves all extremities 4 . Cranial  nerves II through XII grossly intact. Extremities: R knee seems less TTP  Data Reviewed: I have personally reviewed following labs and imaging studies  CBC: Recent Labs  Lab 10/18/23 0046 10/20/23 0543 10/21/23 0458 10/22/23 0534  WBC 6.5 4.5 5.0 5.0  NEUTROABS 4.8 2.0  --   --   HGB 11.4* 11.0* 10.2* 9.8*  HCT 33.7* 33.9* 31.6* 31.2*  MCV 101.5* 104.6* 103.9* 107.2*  PLT 218 172 169 167    Basic Metabolic Panel: Recent Labs  Lab 10/18/23 0046 10/18/23 0912 10/20/23 0543 10/21/23 0501 10/22/23 0534  NA 130* 132* 135 135 132*  K 2.7* 3.2* 3.2* 4.1 3.8  CL 96* 100 103 104 102  CO2 19* 21* 23 24 23   GLUCOSE 205* 120* 94 101* 82  BUN 55* 49* 20 18 17   CREATININE 1.97* 1.64* 0.91 1.12* 1.11*  CALCIUM 9.6 9.4 9.4 8.6* 8.5*  MG 1.4*  --  1.3* 1.7 1.4*  PHOS  --  2.8 1.9* 3.9 2.9    GFR: Estimated Creatinine Clearance: 44.8 mL/min (Ikechukwu Cerny) (by C-G formula based on SCr of 1.11 mg/dL (H)).  Liver Function Tests: Recent Labs  Lab 10/18/23 0046 10/20/23 0543 10/21/23 0501  AST 21 39 37  ALT 22 19 19   ALKPHOS 79 66 62  BILITOT 1.0 0.8 0.6  PROT 7.5 6.3* 5.9*  ALBUMIN 3.3* 2.6* 2.4*    CBG: Recent Labs  Lab 10/21/23 1639 10/21/23 2107 10/22/23 0739 10/22/23 1153 10/22/23 1618  GLUCAP 91 75 86 113* 89     No results found for this or any previous visit (from the past 240 hours).       Radiology Studies: VAS [place] LOWER EXTREMITY VENOUS (DVT) Result Date: 10/22/2023  Lower Venous DVT Study Patient Name:  [name]  Date of Exam:   10/22/2023 Medical Rec #: [id number]       Accession #:    [id number] Date of Birth: May 12, 1946       Patient Gender: F Patient Age:   6 years Exam Location:  [facility] Procedure:      VAS [place] LOWER EXTREMITY VENOUS (DVT) Referring Phys: Ervin Hensley POWELL JR --------------------------------------------------------------------------------  Indications: Swelling.  Risk Factors: None identified. Limitations: Poor ultrasound/tissue  interface and patient pain tolerance. Comparison Study: No prior studies. Performing Technologist: Chanda Busing RVT  Examination Guidelines: Esti Demello complete evaluation includes B-mode imaging, spectral Doppler, color Doppler, and power Doppler as needed of all accessible portions of each vessel. Bilateral testing is considered an integral part of Stefan Karen complete examination. Limited examinations for reoccurring indications may be performed as noted. The reflux portion of the exam is performed with the patient in reverse Trendelenburg.  +---------+---------------+---------+-----------+----------+-------------------+ RIGHT    CompressibilityPhasicitySpontaneityPropertiesThrombus Aging      +---------+---------------+---------+-----------+----------+-------------------+ CFV      Full           Yes      Yes                                      +---------+---------------+---------+-----------+----------+-------------------+  SFJ      Full                                                             +---------+---------------+---------+-----------+----------+-------------------+ FV Prox  Full                                                             +---------+---------------+---------+-----------+----------+-------------------+ FV Mid   Full                                                             +---------+---------------+---------+-----------+----------+-------------------+ FV DistalFull                                                             +---------+---------------+---------+-----------+----------+-------------------+ PFV      Full                                                             +---------+---------------+---------+-----------+----------+-------------------+ POP      Full           Yes      Yes                                      +---------+---------------+---------+-----------+----------+-------------------+ PTV      Full                                                              +---------+---------------+---------+-----------+----------+-------------------+ PERO                                                  Not well visualized +---------+---------------+---------+-----------+----------+-------------------+   +---------+---------------+---------+-----------+----------+--------------+ LEFT     CompressibilityPhasicitySpontaneityPropertiesThrombus Aging +---------+---------------+---------+-----------+----------+--------------+ CFV      Full           Yes      Yes                                 +---------+---------------+---------+-----------+----------+--------------+ SFJ      Full                                                        +---------+---------------+---------+-----------+----------+--------------+  FV Prox  Full                                                        +---------+---------------+---------+-----------+----------+--------------+ FV Mid   Full                                                        +---------+---------------+---------+-----------+----------+--------------+ FV DistalFull                                                        +---------+---------------+---------+-----------+----------+--------------+ PFV      Full                                                        +---------+---------------+---------+-----------+----------+--------------+ POP      Full           Yes      Yes                                 +---------+---------------+---------+-----------+----------+--------------+ PTV      Full                                                        +---------+---------------+---------+-----------+----------+--------------+ PERO     Full                                                        +---------+---------------+---------+-----------+----------+--------------+    Summary: RIGHT: - There is no evidence of deep vein thrombosis  in the lower extremity. However, portions of this examination were limited- see technologist comments above.  - No cystic structure found in the popliteal fossa.  LEFT: - There is no evidence of deep vein thrombosis in the lower extremity.  - No cystic structure found in the popliteal fossa.  *See table(s) above for measurements and observations.    Preliminary    CT KNEE RIGHT WO CONTRAST Result Date: 10/21/2023 CLINICAL DATA:  Right knee pain after fall. EXAM: CT OF THE RIGHT KNEE WITHOUT CONTRAST TECHNIQUE: Multidetector CT imaging of the right knee was performed according to the standard protocol. Multiplanar CT image reconstructions were also generated. RADIATION DOSE REDUCTION: This exam was performed according to the departmental dose-optimization program which includes automated exposure control, adjustment of the mA and/or kV according to patient size and/or use of iterative reconstruction technique. COMPARISON:  Right knee x-rays dated October 19, 2023. FINDINGS: Bones/Joint/Cartilage No fracture or dislocation. Mild  patella lateral tilt and lateral subluxation. Mild-to-moderate medial compartment joint space narrowing. Moderate lateral patellofemoral compartment joint space narrowing. No joint effusion. Ligaments Ligaments are suboptimally evaluated by CT. Muscles and Tendons Grossly intact. Soft tissue No fluid collection or hematoma.  No soft tissue mass. IMPRESSION: 1. No acute osseous abnormality. 2. 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 Electronically Signed   By: Obie Dredge M.D.   On: 10/21/2023 19:03        Scheduled Meds:  acetaminophen  1,000 mg Oral Q8H   folic acid  1 mg Oral Daily   ketorolac  15 mg Intravenous Q6H   LORazepam  0-4 mg Oral Q12H   losartan  25 mg Oral Daily   multivitamin with minerals  1 tablet Oral Daily   nebivolol  10 mg Oral Daily   pantoprazole  40 mg Oral Daily   thiamine  100 mg Oral Daily   Continuous  Infusions:     LOS: 3 days    Time spent: over 30 min    Lacretia Nicks, MD [facility]   To contact the attending provider between 7A-7P or the covering provider during after hours 7P-7A, please log into the web site [email or web address] and access using universal [email or web address] password for that web site. If you do not have the password, please call the hospital operator.  10/22/2023, 4:40 PM

## 2023-10-22 NOTE — TOC Progression Note (Signed)
 Transition of Care [facility]) - Progression Note    Patient Details  Name: [name] MRN: [id number] Date of Birth: 07/11/1946  Transition of [facility]) CM/SW Contact  Otelia Santee, LCSW Phone Number: 10/22/2023, 11:53 AM  Clinical Narrative:    Met with pt to discuss alcohol use. Pt minimizes use and denies need for resources.  Informed that after reviewing chart [facility] no longer able to provide [facility] services.  HHPT/OT has been arranged with [facility]. HH order will need to be placed prior to discharge.   Expected Discharge Plan: Home w Home Health Services Barriers to Discharge: Continued Medical Work up  Expected Discharge Plan and Services     Post Acute Care Choice: Durable Medical Equipment Living arrangements for the past 2 months: Single Family Home                 DME Arranged: Walker rolling DME Agency: [employer] Date DME Agency Contacted: 10/19/23 Time DME Agency Contacted: 1310 Representative spoke with at DME Agency: Vaughan Basta HH Arranged: PT, OT HH Agency: [facility] Date [facility] Agency Contacted: 10/22/23 Time HH Agency Contacted: 1153 Representative spoke with at [facility] Agency: Clifton Custard   Social Determinants of Health (SDOH) Interventions SDOH Screenings   Food Insecurity: No Food Insecurity (10/18/2023)  Housing: Low Risk  (10/18/2023)  Transportation Needs: No Transportation Needs (10/22/2023)  Recent Concern: Transportation Needs - Unmet Transportation Needs (10/18/2023)  Utilities: Not At Risk (10/18/2023)  Alcohol Screen: Low Risk  (10/22/2023)  Social Connections: Socially Isolated (10/22/2023)  Tobacco Use: Low Risk  (10/18/2023)    Readmission Risk Interventions     No data to display

## 2023-10-22 NOTE — Plan of Care (Signed)
  Problem: Nutrition: Goal: Adequate nutrition will be maintained Outcome: Progressing   Problem: Safety: Goal: Ability to remain free from injury will improve Outcome: Progressing   Problem: Skin Integrity: Goal: Risk for impaired skin integrity will decrease Outcome: Progressing   Problem: Pain Managment: Goal: General experience of comfort will improve and/or be controlled Outcome: Progressing

## 2023-10-22 NOTE — Plan of Care (Signed)

## 2023-10-22 NOTE — Progress Notes (Signed)
 Physical Therapy Treatment Patient Details Name: [name] MRN: [id number] DOB: August 20, 1945 Today's Date: 10/22/2023   History of Present Illness 78 yo female presented to ED on 10/17/2023 due to mechanical fall in home setting, and R LE weakness, AKI and abn labs. 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 Imaging of R hip and back negative for acute findings. PMH unremarkable.    PT Comments  PT - Cognition Comments: AxO x 3 required Max encouragement.  "Everytime I want to rest, someone else comes in".  Pt did agree to get OOB and use the bathroom. General bed mobility comments: self able to transfer to EOB with increased time but did ask for assistance to support B LE up onto bed. General transfer comment: pt self able to rise using walker and B UE's to support self.  Pt also able to self perform a toilet transfer as well as perform self peri care.  Pt c/o R knee pain and present with decreased WBing through R LE and using walker correctly to support self. General Gait Details: Pt tolerated amb to the bathrrom with her walker correctly navigating around bed and through doorway.  Did require a seated rest break before attempting to amb in hallway.  Slow, shuffled steps and using walker for support(which she promised to use once home) Hx falls. LPT has rec SNF however Pt declines.  RW was delivered and in room.     If plan is discharge home, recommend the following: A little help with walking and/or transfers;A little help with bathing/dressing/bathroom;Assistance with cooking/housework;Assist for transportation   Can travel by private vehicle        Equipment Recommendations  Rolling walker (2 wheels)    Recommendations for Other Services       Precautions / Restrictions Precautions Precautions: Fall Recall of Precautions/Restrictions: Intact Restrictions Weight Bearing Restrictions Per Provider Order: No Other Position/Activity Restrictions: WBAT     Mobility  Bed  Mobility Overal bed mobility: Needs Assistance Bed Mobility: Supine to Sit     Supine to sit: Supervision Sit to supine: Min assist   General bed mobility comments: self able to transfer to EOB with increased time but did ask for assistance to support B LE up onto bed.    Transfers Overall transfer level: Needs assistance Equipment used: Rolling walker (2 wheels) Transfers: Sit to/from Stand Sit to Stand: Supervision           General transfer comment: pt self able to rise using walker and B UE's to support self.  Pt also able to self perform a toilet transfer as well as perform self peri care.  Pt c/o R knee pain and present with decreased WBing through R LE and using walker correctly to support self.    Ambulation/Gait Ambulation/Gait assistance: Supervision Gait Distance (Feet): 48 Feet Assistive device: Rolling walker (2 wheels) Gait Pattern/deviations: Decreased step length - right, Decreased step length - left, Trunk flexed Gait velocity: decreased     General Gait Details: Pt tolerated amb to the bathrrom with her walker correctly navigating around bed and through doorway.  Did require a seated rest break before attempting to amb in hallway.  Slow, shuffled steps and using walker for support(which she promised to use once home) Hx falls.   Stairs             Wheelchair Mobility     Tilt Bed    Modified Rankin (Stroke Patients Only)       Balance  Communication    Cognition Arousal: Alert Behavior During Therapy: WFL for tasks assessed/performed   PT - Cognitive impairments: No apparent impairments                       PT - Cognition Comments: AxO x 3 required Max encouragement.  "Everytime I want to rest, someone else comes in".  Pt did agree to get OOB and use the bathroom. Following commands: Intact      Cueing    Exercises      General Comments        Pertinent  Vitals/Pain Pain Assessment Pain Assessment: Faces Faces Pain Scale: Hurts a little bit Pain Location: R knee Pain Descriptors / Indicators: Aching, Guarding Pain Intervention(s): Monitored during session, Repositioned    Home Living                          Prior Function            PT Goals (current goals can now be found in the care plan section) Progress towards PT goals: Progressing toward goals    Frequency    Min 3X/week      PT Plan      Co-evaluation              AM-PAC PT "6 Clicks" Mobility   Outcome Measure  Help needed turning from your back to your side while in a flat bed without using bedrails?: A Little Help needed moving from lying on your back to sitting on the side of a flat bed without using bedrails?: A Little Help needed moving to and from a bed to a chair (including a wheelchair)?: A Little Help needed standing up from a chair using your arms (e.g., wheelchair or bedside chair)?: A Little Help needed to walk in hospital room?: A Little Help needed climbing 3-5 steps with a railing? : A Little 6 Click Score: 18    End of Session Equipment Utilized During Treatment: Gait belt Activity Tolerance: Patient tolerated treatment well Patient left: in bed;with call bell/phone within reach Nurse Communication: Mobility status PT Visit Diagnosis: Unsteadiness on feet (R26.81);Muscle weakness (generalized) (M62.81);Difficulty in walking, not elsewhere classified (R26.2);History of falling (Z91.81)     Time: 1610-9604 PT Time Calculation (min) (ACUTE ONLY): 24 min  Charges:    $Gait Training: 8-22 mins $Therapeutic Activity: 8-22 mins PT General Charges $$ ACUTE PT VISIT: 1 Visit                     Felecia Shelling  PTA Acute  Rehabilitation Services Office M-F          [phone]

## 2023-10-22 NOTE — Progress Notes (Signed)
 Bilateral lower extremity venous duplex has been completed. Preliminary results can be found in CV Proc through chart review.   10/22/23 8:35 AM Olen Cordial RVT

## 2023-10-23 DIAGNOSIS — M6282 Rhabdomyolysis: Secondary | ICD-10-CM | POA: Diagnosis not present

## 2023-10-23 DIAGNOSIS — N179 Acute kidney failure, unspecified: Secondary | ICD-10-CM | POA: Diagnosis not present

## 2023-10-23 LAB — CBC
HCT: 32.7 % — ABNORMAL LOW (ref 36.0–46.0)
Hemoglobin: 10.3 g/dL — ABNORMAL LOW (ref 12.0–15.0)
MCH: 33.9 pg (ref 26.0–34.0)
MCHC: 31.5 g/dL (ref 30.0–36.0)
MCV: 107.6 fL — ABNORMAL HIGH (ref 80.0–100.0)
Platelets: 192 10*3/uL (ref 150–400)
RBC: 3.04 MIL/uL — ABNORMAL LOW (ref 3.87–5.11)
RDW: 12.5 % (ref 11.5–15.5)
WBC: 4.6 10*3/uL (ref 4.0–10.5)
nRBC: 0 % (ref 0.0–0.2)

## 2023-10-23 LAB — BASIC METABOLIC PANEL
Anion gap: 8 (ref 5–15)
BUN: 11 mg/dL (ref 8–23)
CO2: 23 mmol/L (ref 22–32)
Calcium: 8.8 mg/dL — ABNORMAL LOW (ref 8.9–10.3)
Chloride: 102 mmol/L (ref 98–111)
Creatinine, Ser: 0.85 mg/dL (ref 0.44–1.00)
GFR, Estimated: >60 mL/min (ref >60)
Glucose, Bld: 83 mg/dL (ref 70–99)
Potassium: 3.8 mmol/L (ref 3.5–5.1)
Sodium: 133 mmol/L — ABNORMAL LOW (ref 135–145)

## 2023-10-23 LAB — GLUCOSE, CAPILLARY
Glucose-Capillary: 81 mg/dL (ref 70–99)
Glucose-Capillary: 150 mg/dL — ABNORMAL HIGH (ref 70–99)
Glucose-Capillary: 143 mg/dL — ABNORMAL HIGH (ref 70–99)
Glucose-Capillary: 110 mg/dL — ABNORMAL HIGH (ref 70–99)

## 2023-10-23 LAB — MAGNESIUM: Magnesium: 1.7 mg/dL (ref 1.7–2.4)

## 2023-10-23 LAB — PHOSPHORUS: Phosphorus: 2.3 mg/dL — ABNORMAL LOW (ref 2.5–4.6)

## 2023-10-23 MED ORDER — K PHOS MONO-SOD PHOS DI & MONO 155-852-130 MG PO TABS
500.0000 mg | ORAL_TABLET | Freq: Once | ORAL | Status: AC
  Administered 2023-10-23: 500 mg via ORAL
  Filled 2023-10-23 (×2): qty 2

## 2023-10-23 NOTE — Progress Notes (Signed)
 Progress Note    [name]   ZOX:[id number]  DOB: 18-Jun-1946  DOA: 10/17/2023     4 PCP: Renaye Rakers, MD  Initial CC: fall at home  Hospital Course: 78 y.o. female with medical history significant of type 2 diabetes, hyperlipidemia, hypertension who presented to the emergency department with complaints of right lower extremity pain after having a mechanical fall at home.   Stable for discharge to SNF when bed available.  Continued R knee pain, but this seems to be gradually improving.   Interval History:  No events overnight. Resting comfortably in bed.  Stable for discharge.   Assessment and Plan:  Mechanical Fall Right knee osteoarthritis - s/p fall at home - CT knee shows mild to moderate medial compartment joint space narrowing consistent with osteoarthritis - SNF pending  Lumbar spinal stenosis - did complain of right foot "numbness" but seems intermittent - MRI L spine shows L4-5 mod/severe canal stenosis (L>R) - if persistent symptoms, can follow up with neurosurgery outpatient    Etoh abuse - counseled on cessation or cutting back   Memory Concerns Noted from family Will plan for neurology follow up outpatient B12 low normal as above TSH normal   AKI Metabolic Acidosis Unclear baseline, now normalized    Rhabdomyolysis Gradual improvement - s/p IVF   Hypertension -Continue amlodipine and nebivolol   Hypokalemia Hypophosphatemia Hypomagnesemia -Replete as needed   T2DM Metformin on hold SSI   Dyslipidemia Not taking a statin per med rec    Old records reviewed in assessment of this patient  Antimicrobials:   DVT prophylaxis:  SCDs Start: 10/18/23 0556   Code Status:   Code Status: Full Code  Mobility Assessment (Last 72 Hours)     Mobility Assessment     Row Name 10/23/23 0853 10/22/23 2054 10/22/23 1305 10/22/23 0715 10/21/23 1938   Does patient have an order for bedrest or is patient medically unstable No - Continue  assessment No - Continue assessment -- No - Continue assessment No - Continue assessment   What is the highest level of mobility based on the progressive mobility assessment? Level 4 (Walks with assist in room) - Balance while marching in place and cannot step forward and back - Complete Level 4 (Walks with assist in room) - Balance while marching in place and cannot step forward and back - Complete Level 4 (Walks with assist in room) - Balance while marching in place and cannot step forward and back - Complete Level 4 (Walks with assist in room) - Balance while marching in place and cannot step forward and back - Complete Level 4 (Walks with assist in room) - Balance while marching in place and cannot step forward and back - Complete    Row Name 10/21/23 0900 10/21/23 0737 10/21/23 0430 10/20/23 2005     Does patient have an order for bedrest or is patient medically unstable -- No - Continue assessment No - Continue assessment No - Continue assessment    What is the highest level of mobility based on the progressive mobility assessment? Level 5 (Walks with assist in room/hall) - Balance while stepping forward/back and can walk in room with assist - Complete Level 4 (Walks with assist in room) - Balance while marching in place and cannot step forward and back - Complete Level 4 (Walks with assist in room) - Balance while marching in place and cannot step forward and back - Complete Level 4 (Walks with assist in room) - Balance while  marching in place and cannot step forward and back - Complete             Barriers to discharge: none Disposition Plan:  HH HH orders placed: PT Status is: Inpt  Objective: Blood pressure (!) 137/110, pulse 75, temperature 98.6 F (37 C), temperature source Axillary, resp. rate 15, height 5\' 5"  (1.651 m), weight 81.8 kg, SpO2 99%.  Examination:  Physical Exam Constitutional:      Appearance: Normal appearance.  HENT:     Head: Normocephalic and atraumatic.      Mouth/Throat:     Mouth: Mucous membranes are moist.  Eyes:     Extraocular Movements: Extraocular movements intact.  Cardiovascular:     Rate and Rhythm: Normal rate and regular rhythm.  Pulmonary:     Effort: Pulmonary effort is normal. No respiratory distress.     Breath sounds: Normal breath sounds. No wheezing.  Abdominal:     General: Bowel sounds are normal. There is no distension.     Palpations: Abdomen is soft.     Tenderness: There is no abdominal tenderness.  Musculoskeletal:        General: Normal range of motion.     Cervical back: Normal range of motion and neck supple.  Skin:    General: Skin is warm and dry.  Neurological:     General: No focal deficit present.     Mental Status: She is alert.  Psychiatric:        Mood and Affect: Mood normal.      Consultants:    Procedures:    Data Reviewed: Results for orders placed or performed during the hospital encounter of 10/17/23 (from the past 24 hours)  Glucose, capillary     Status: None   Collection Time: 10/22/23  4:18 PM  Result Value Ref Range   Glucose-Capillary 89 70 - 99 mg/dL   Comment 1 Notify RN    Comment 2 Document in Chart   Glucose, capillary     Status: None   Collection Time: 10/22/23  8:42 PM  Result Value Ref Range   Glucose-Capillary 85 70 - 99 mg/dL  Basic metabolic panel     Status: Abnormal   Collection Time: 10/23/23  5:48 AM  Result Value Ref Range   Sodium 133 (L) 135 - 145 mmol/L   Potassium 3.8 3.5 - 5.1 mmol/L   Chloride 102 98 - 111 mmol/L   CO2 23 22 - 32 mmol/L   Glucose, Bld 83 70 - 99 mg/dL   BUN 11 8 - 23 mg/dL   Creatinine, Ser 7.82 0.44 - 1.00 mg/dL   Calcium 8.8 (L) 8.9 - 10.3 mg/dL   GFR, Estimated >95 >62 mL/min   Anion gap 8 5 - 15  CBC     Status: Abnormal   Collection Time: 10/23/23  5:48 AM  Result Value Ref Range   WBC 4.6 4.0 - 10.5 K/uL   RBC 3.04 (L) 3.87 - 5.11 MIL/uL   Hemoglobin 10.3 (L) 12.0 - 15.0 g/dL   HCT 13.0 (L) 86.5 - 78.4 %   MCV  107.6 (H) 80.0 - 100.0 fL   MCH 33.9 26.0 - 34.0 pg   MCHC 31.5 30.0 - 36.0 g/dL   RDW 69.6 29.5 - 28.4 %   Platelets 192 150 - 400 K/uL   nRBC 0.0 0.0 - 0.2 %  Magnesium     Status: None   Collection Time: 10/23/23  5:48 AM  Result Value Ref  Range   Magnesium 1.7 1.7 - 2.4 mg/dL  Phosphorus     Status: Abnormal   Collection Time: 10/23/23  5:48 AM  Result Value Ref Range   Phosphorus 2.3 (L) 2.5 - 4.6 mg/dL  Glucose, capillary     Status: None   Collection Time: 10/23/23  7:23 AM  Result Value Ref Range   Glucose-Capillary 81 70 - 99 mg/dL   Comment 1 Notify RN    Comment 2 Document in Chart   Glucose, capillary     Status: Abnormal   Collection Time: 10/23/23 11:36 AM  Result Value Ref Range   Glucose-Capillary 150 (H) 70 - 99 mg/dL   Comment 1 Notify RN    Comment 2 Document in Chart     I have reviewed pertinent nursing notes, vitals, labs, and images as necessary. I have ordered labwork to follow up on as indicated.  I have reviewed the last notes from staff over past 24 hours. I have discussed patient's care plan and test results with nursing staff, CM/SW, and other staff as appropriate.  Time spent: Greater than 50% of the 55 minute visit was spent in counseling/coordination of care for the patient as laid out in the A&P.   LOS: 4 days   Lewie Chamber, MD [facility] 10/23/2023, 3:04 PM

## 2023-10-23 NOTE — Hospital Course (Signed)
 78 y.o. female with medical history significant of type 2 diabetes, hyperlipidemia, hypertension who presented to the emergency department with complaints of right lower extremity pain after having a mechanical fall at home.   Stable for discharge to SNF when bed available.  Continued R knee pain, but this seems to be gradually improving.

## 2023-10-24 DIAGNOSIS — W19XXXA Unspecified fall, initial encounter: Secondary | ICD-10-CM | POA: Diagnosis not present

## 2023-10-24 DIAGNOSIS — N179 Acute kidney failure, unspecified: Secondary | ICD-10-CM | POA: Diagnosis not present

## 2023-10-24 LAB — GLUCOSE, CAPILLARY: Glucose-Capillary: 95 mg/dL (ref 70–99)

## 2023-10-24 NOTE — Discharge Summary (Signed)
 Physician Discharge Summary   [name]:[id number] DOB: [birth year] DOA: 10/17/2023  PCP: Renaye Rakers, MD  Admit date: 10/17/2023 Discharge date: 10/24/2023   Admitted From: Home  Disposition:  Home Discharging physician: Lewie Chamber, MD Barriers to discharge: none  Home Health: PT Equipment/Devices: RW  Discharge Condition: stable CODE STATUS: Full  Diet recommendation:  Diet Orders (From admission, onward)     Start     Ordered   10/24/23 0000  Diet general        10/24/23 1021   10/18/23 0556  Diet regular Room service appropriate? Yes; Fluid consistency: Thin  Diet effective now       Question Answer Comment  Room service appropriate? Yes   Fluid consistency: Thin      10/18/23 0555            Hospital Course: 78 y.o. female with medical history significant of type 2 diabetes, hyperlipidemia, hypertension who presented to the emergency department with complaints of right lower extremity pain after having a mechanical fall at home.  Assessment and Plan:  Mechanical Fall Right knee osteoarthritis - s/p fall at home - CT knee shows mild to moderate medial compartment joint space narrowing consistent with osteoarthritis   Lumbar spinal stenosis - did complain of right foot "numbness" but seems intermittent - MRI L spine shows L4-5 mod/severe canal stenosis (L>R) - if persistent symptoms, can follow up with neurosurgery outpatient    Etoh abuse - counseled on cessation or cutting back   Memory Concerns Noted from family Will plan for neurology follow up outpatient B12 low normal as above TSH normal   AKI Metabolic Acidosis Unclear baseline, now normalized    Rhabdomyolysis Gradual improvement - s/p IVF   Hypertension -Continue amlodipine and nebivolol   Hypokalemia Hypophosphatemia Hypomagnesemia -Repleted   T2DM Metformin resumed   Dyslipidemia Not taking a statin per med rec      The patient's acute and chronic medical  conditions were treated accordingly. On day of discharge, patient was felt deemed stable for discharge. Patient/family member advised to call PCP or come back to ER if needed.   Principal Diagnosis: AKI (acute kidney injury) [facility])  Discharge Diagnoses: Active Hospital Problems   Diagnosis Date Noted   AKI (acute kidney injury) (HCC) 10/18/2023    Priority: 1.   Fall 10/19/2023    Priority: 1.   Macrocytic anemia 10/18/2023   Hypomagnesemia 10/18/2023   Hyponatremia 10/18/2023   Hypokalemia 10/18/2023   Mild protein malnutrition (HCC) 10/18/2023   Hypocarbia 10/18/2023   DDD (degenerative disc disease), lumbar 10/18/2023   Hyperlipidemia 10/18/2023   Type 2 diabetes mellitus with hyperglycemia (HCC) 10/18/2023   Essential hypertension 10/18/2023   Alcohol abuse 10/18/2023   Metabolic acidosis with respiratory alkalosis 10/18/2023    Resolved Hospital Problems  No resolved problems to display.     Discharge Instructions     Diet general   Complete by: As directed    Increase activity slowly   Complete by: As directed       Allergies as of 10/24/2023       Reactions   Penicillins Rash        Medication List     TAKE these medications    acetaminophen 500 MG tablet Commonly known as: TYLENOL Take 500-1,000 mg by mouth every 6 (six) hours as needed for mild pain (pain score 1-3) or headache (or dental pain).   amLODipine 10 MG tablet Commonly known as: NORVASC Take 10 mg  by mouth at bedtime.   BC HEADACHE POWDER PO Take 1 packet by mouth as needed (pain).   indapamide 1.25 MG tablet Commonly known as: LOZOL Take 1.25 mg by mouth daily.   metFORMIN 500 MG tablet Commonly known as: GLUCOPHAGE Take 500 mg by mouth daily.   nebivolol 10 MG tablet Commonly known as: BYSTOLIC Take 10 mg by mouth daily.   ONE-A-DAY WOMENS PO Take 1 tablet by mouth daily with breakfast.   potassium chloride 10 MEQ CR capsule Commonly known as: MICRO-K Take 10 mEq by  mouth daily.               Durable Medical Equipment  (From admission, onward)           Start     Ordered   10/23/23 1536  For home use only DME Walker rolling  Once       Question Answer Comment  Walker: With 5 Inch Wheels   Patient needs a walker to treat with the following condition Generalized muscle weakness      10/23/23 1535            Allergies  Allergen Reactions   Penicillins Rash    Consultations:   Procedures:   Discharge Exam: BP 135/66 (BP Location: Right Arm)   Pulse 67   Temp 98.1 F (36.7 C)   Resp 16   Ht 5\' 5"  (1.651 m)   Wt 79.5 kg   SpO2 97%   BMI 29.17 kg/m  Physical Exam Constitutional:      Appearance: Normal appearance.  HENT:     Head: Normocephalic and atraumatic.     Mouth/Throat:     Mouth: Mucous membranes are moist.  Eyes:     Extraocular Movements: Extraocular movements intact.  Cardiovascular:     Rate and Rhythm: Normal rate and regular rhythm.  Pulmonary:     Effort: Pulmonary effort is normal. No respiratory distress.     Breath sounds: Normal breath sounds. No wheezing.  Abdominal:     General: Bowel sounds are normal. There is no distension.     Palpations: Abdomen is soft.     Tenderness: There is no abdominal tenderness.  Musculoskeletal:        General: Normal range of motion.     Cervical back: Normal range of motion and neck supple.  Skin:    General: Skin is warm and dry.  Neurological:     General: No focal deficit present.     Mental Status: She is alert.  Psychiatric:        Mood and Affect: Mood normal.      The results of significant diagnostics from this hospitalization (including imaging, microbiology, ancillary and laboratory) are listed below for reference.   Microbiology: No results found for this or any previous visit (from the past 240 hours).   Labs: BNP (last 3 results) No results for input(s): "BNP" in the last 8760 hours. Basic Metabolic Panel: Recent Labs  Lab  10/18/23 0046 10/18/23 0912 10/20/23 0543 10/21/23 0501 10/22/23 0534 10/23/23 0548  NA 130* 132* 135 135 132* 133*  K 2.7* 3.2* 3.2* 4.1 3.8 3.8  CL 96* 100 103 104 102 102  CO2 19* 21* 23 24 23 23   GLUCOSE 205* 120* 94 101* 82 83  BUN 55* 49* 20 18 17 11   CREATININE 1.97* 1.64* 0.91 1.12* 1.11* 0.85  CALCIUM 9.6 9.4 9.4 8.6* 8.5* 8.8*  MG 1.4*  --  1.3* 1.7 1.4*  1.7  PHOS  --  2.8 1.9* 3.9 2.9 2.3*   Liver Function Tests: Recent Labs  Lab 10/18/23 0046 10/20/23 0543 10/21/23 0501  AST 21 39 37  ALT 22 19 19   ALKPHOS 79 66 62  BILITOT 1.0 0.8 0.6  PROT 7.5 6.3* 5.9*  ALBUMIN 3.3* 2.6* 2.4*   No results for input(s): "LIPASE", "AMYLASE" in the last 168 hours. No results for input(s): "AMMONIA" in the last 168 hours. CBC: Recent Labs  Lab 10/18/23 0046 10/20/23 0543 10/21/23 0458 10/22/23 0534 10/23/23 0548  WBC 6.5 4.5 5.0 5.0 4.6  NEUTROABS 4.8 2.0  --   --   --   HGB 11.4* 11.0* 10.2* 9.8* 10.3*  HCT 33.7* 33.9* 31.6* 31.2* 32.7*  MCV 101.5* 104.6* 103.9* 107.2* 107.6*  PLT 218 172 169 167 192   Cardiac Enzymes: Recent Labs  Lab 10/18/23 2033 10/19/23 1700 10/20/23 0543 10/21/23 0501  CKTOTAL 1,034* 1,432* 1,416* 1,199*   BNP: Invalid input(s): "POCBNP" CBG: Recent Labs  Lab 10/23/23 0723 10/23/23 1136 10/23/23 1655 10/23/23 2054 10/24/23 0804  GLUCAP 81 150* 143* 110* 95   D-Dimer No results for input(s): "DDIMER" in the last 72 hours. Hgb A1c No results for input(s): "HGBA1C" in the last 72 hours. Lipid Profile No results for input(s): "CHOL", "HDL", "LDLCALC", "TRIG", "CHOLHDL", "LDLDIRECT" in the last 72 hours. Thyroid function studies Recent Labs    10/22/23 0534  TSH 1.557   Anemia work up No results for input(s): "VITAMINB12", "FOLATE", "FERRITIN", "TIBC", "IRON", "RETICCTPCT" in the last 72 hours. Urinalysis    Component Value Date/Time   COLORURINE YELLOW 11/02/2010 2302   APPEARANCEUR CLOUDY (A) 11/02/2010 2302    LABSPEC 1.028 11/02/2010 2302   PHURINE 5.5 11/02/2010 2302   GLUCOSEU NEGATIVE 11/02/2010 2302   HGBUR SMALL (A) 11/02/2010 2302   BILIRUBINUR NEGATIVE 11/02/2010 2302   KETONESUR TRACE (A) 11/02/2010 2302   PROTEINUR NEGATIVE 11/02/2010 2302   UROBILINOGEN 1.0 11/02/2010 2302   NITRITE NEGATIVE 11/02/2010 2302   LEUKOCYTESUR MODERATE (A) 11/02/2010 2302   Sepsis Labs Recent Labs  Lab 10/20/23 0543 10/21/23 0458 10/22/23 0534 10/23/23 0548  WBC 4.5 5.0 5.0 4.6   Microbiology No results found for this or any previous visit (from the past 240 hours).  Procedures/Studies: VAS [place] LOWER EXTREMITY VENOUS (DVT) Result Date: 10/22/2023  Lower Venous DVT Study Patient Name:  RYLAH FUKUDA  Date of Exam:   10/22/2023 Medical Rec #: [id number]       Accession #:    [id number] Date of Birth: 05/27/1946       Patient Gender: F Patient Age:   89 years Exam Location:  [facility] Procedure:      VAS [place] LOWER EXTREMITY VENOUS (DVT) Referring Phys: A POWELL JR --------------------------------------------------------------------------------  Indications: Swelling.  Risk Factors: None identified. Limitations: Poor ultrasound/tissue interface and patient pain tolerance. Comparison Study: No prior studies. Performing Technologist: Chanda Busing RVT  Examination Guidelines: A complete evaluation includes B-mode imaging, spectral Doppler, color Doppler, and power Doppler as needed of all accessible portions of each vessel. Bilateral testing is considered an integral part of a complete examination. Limited examinations for reoccurring indications may be performed as noted. The reflux portion of the exam is performed with the patient in reverse Trendelenburg.  +---------+---------------+---------+-----------+----------+-------------------+ RIGHT    CompressibilityPhasicitySpontaneityPropertiesThrombus Aging      +---------+---------------+---------+-----------+----------+-------------------+ CFV       Full           Yes  Yes                                      +---------+---------------+---------+-----------+----------+-------------------+ SFJ      Full                                                             +---------+---------------+---------+-----------+----------+-------------------+ FV Prox  Full                                                             +---------+---------------+---------+-----------+----------+-------------------+ FV Mid   Full                                                             +---------+---------------+---------+-----------+----------+-------------------+ FV DistalFull                                                             +---------+---------------+---------+-----------+----------+-------------------+ PFV      Full                                                             +---------+---------------+---------+-----------+----------+-------------------+ POP      Full           Yes      Yes                                      +---------+---------------+---------+-----------+----------+-------------------+ PTV      Full                                                             +---------+---------------+---------+-----------+----------+-------------------+ PERO                                                  Not well visualized +---------+---------------+---------+-----------+----------+-------------------+   +---------+---------------+---------+-----------+----------+--------------+ LEFT     CompressibilityPhasicitySpontaneityPropertiesThrombus Aging +---------+---------------+---------+-----------+----------+--------------+ CFV      Full           Yes      Yes                                 +---------+---------------+---------+-----------+----------+--------------+  SFJ      Full                                                         +---------+---------------+---------+-----------+----------+--------------+ FV Prox  Full                                                        +---------+---------------+---------+-----------+----------+--------------+ FV Mid   Full                                                        +---------+---------------+---------+-----------+----------+--------------+ FV DistalFull                                                        +---------+---------------+---------+-----------+----------+--------------+ PFV      Full                                                        +---------+---------------+---------+-----------+----------+--------------+ POP      Full           Yes      Yes                                 +---------+---------------+---------+-----------+----------+--------------+ PTV      Full                                                        +---------+---------------+---------+-----------+----------+--------------+ PERO     Full                                                        +---------+---------------+---------+-----------+----------+--------------+     Summary: RIGHT: - There is no evidence of deep vein thrombosis in the lower extremity. However, portions of this examination were limited- see technologist comments above.  - No cystic structure found in the popliteal fossa.  LEFT: - There is no evidence of deep vein thrombosis in the lower extremity.  - No cystic structure found in the popliteal fossa.  *See table(s) above for measurements and observations. Electronically signed by Lemar Livings MD on 10/22/2023 at 4:56:29 PM.    Final    CT KNEE RIGHT WO CONTRAST Result Date: 10/21/2023 CLINICAL DATA:  Right knee pain after fall. EXAM: CT OF THE RIGHT  KNEE WITHOUT CONTRAST TECHNIQUE: Multidetector CT imaging of the right knee was performed according to the standard protocol. Multiplanar CT image reconstructions were also generated. RADIATION  DOSE REDUCTION: This exam was performed according to the departmental dose-optimization program which includes automated exposure control, adjustment of the mA and/or kV according to patient size and/or use of iterative reconstruction technique. COMPARISON:  Right knee x-rays dated October 19, 2023. FINDINGS: Bones/Joint/Cartilage No fracture or dislocation. Mild patella lateral tilt and lateral subluxation. Mild-to-moderate medial compartment joint space narrowing. Moderate lateral patellofemoral compartment joint space narrowing. No joint effusion. Ligaments Ligaments are suboptimally evaluated by CT. Muscles and Tendons Grossly intact. Soft tissue No fluid collection or hematoma.  No soft tissue mass. IMPRESSION: 1. No acute osseous abnormality. 2. 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 Electronically Signed   By: Obie Dredge M.D.   On: 10/21/2023 19:03   MR LUMBAR SPINE WO CONTRAST Result Date: 10/20/2023 CLINICAL DATA:  Low back pain, trauma right lower extremity pain, numbness EXAM: MRI LUMBAR SPINE WITHOUT CONTRAST TECHNIQUE: Multiplanar, multisequence MR imaging of the lumbar spine was performed. No intravenous contrast was administered. COMPARISON:  Lumbar radiographs October 18, 2023. FINDINGS: Segmentation:  Standard. Alignment: Grade 1 anterolisthesis of L4 on L5. Vertebrae:  No fracture, evidence of discitis, or bone lesion. Conus medullaris and cauda equina: Conus extends to the T12-L1 level. Conus and cauda equina appear normal. Paraspinal and other soft tissues: Partially imaged left renal cysts. Disc levels: T12-L1: No significant disc protrusion, foraminal stenosis, or canal stenosis. L1-L2: No significant disc protrusion, foraminal stenosis, or canal stenosis. L2-L3: Disc bulge and facet arthropathy. No significant canal or foraminal stenosis. L3-L4: Disc bulging and bilateral facet arthropathy. Ligamentum flavum thickening. Resulting mild right subarticular recess  stenosis and mild left foraminal stenosis. L4-L5: Grade 1 anterolisthesis of L4 on L5. Uncovering the disc and superimposed disc bulging. Resulting moderate to severe canal stenosis with bilateral subarticular recess stenosis. Moderate left greater than right foraminal stenosis. L5-S1: Mild disc bulge. Bilateral facet arthropathy appears [place] mild bilateral foraminal stenosis. Patent canal. IMPRESSION: 1. At L4-L5, grade 1 anterolisthesis with moderate to severe canal stenosis and bilateral subarticular recess stenosis. Moderate left greater than right foraminal stenosis. 2. At L3-L4, mild right subarticular recess stenosis and mild left foraminal stenosis. 3. At L5-S1, mild bilateral foraminal stenosis. Electronically Signed   By: Feliberto Harts M.D.   On: 10/20/2023 00:50   DG Knee Complete 4 Views Right Result Date: 10/19/2023 CLINICAL DATA:  144615 Pain 144615 EXAM: RIGHT KNEE - COMPLETE 4+ VIEW COMPARISON:  None Available. FINDINGS: Osteopenia. No acute fracture or dislocation. Osseous excrescence along the medial femoral condyle most consistent with sequela of remote prior MCL injury. Mild joint space narrowing of the medial compartment with osteophyte formation of the medial compartment and patellofemoral compartment. No area of erosion or osseous destruction. No unexpected radiopaque foreign body. Atherosclerotic calcifications. IMPRESSION: Mild degenerative changes of the medial and patellofemoral compartments. Electronically Signed   By: Meda Klinefelter M.D.   On: 10/19/2023 15:58   CT Head Wo Contrast Result Date: 10/18/2023 CLINICAL DATA:  Recent fall with headaches, initial encounter EXAM: CT HEAD WITHOUT CONTRAST TECHNIQUE: Contiguous axial images were obtained from the base of the skull through the vertex without intravenous contrast. RADIATION DOSE REDUCTION: This exam was performed according to the departmental dose-optimization program which includes automated exposure control,  adjustment of the mA and/or kV according to patient size and/or use of iterative reconstruction technique. COMPARISON:  None Available. FINDINGS:  Brain: No evidence of acute infarction, hemorrhage, hydrocephalus, extra-axial collection or mass lesion/mass effect. Chronic atrophic and ischemic changes are noted. Vascular: No hyperdense vessel or unexpected calcification. Skull: Normal. Negative for fracture or focal lesion. Sinuses/Orbits: No acute finding. Other: None. IMPRESSION: Chronic atrophic and ischemic changes without acute abnormality. Electronically Signed   By: Alcide Clever M.D.   On: 10/18/2023 03:25   DG Lumbar Spine 2-3 Views Result Date: 10/18/2023 CLINICAL DATA:  Recent fall with low back pain, initial encounter EXAM: LUMBAR SPINE - 3 VIEW COMPARISON:  None Available. FINDINGS: Five lumbar type vertebral bodies are well visualized. Vertebral body height is well maintained. Osteophytic changes are noted with mild anterolisthesis of L4 on L5. Facet hypertrophic changes are seen. No soft tissue abnormality is noted. 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 Electronically Signed   By: Alcide Clever M.D.   On: 10/18/2023 03:18   DG HIP UNILAT W OR W/O PELVIS 2-3 VIEWS RIGHT Result Date: 10/18/2023 CLINICAL DATA:  Recent fall with right hip pain, initial encounter EXAM: DG HIP (WITH OR WITHOUT PELVIS) 3V RIGHT COMPARISON:  None Available. FINDINGS: Pelvic ring is intact. Calcified uterine fibroids are noted. No acute fracture or dislocation is noted. No soft tissue changes are seen. IMPRESSION: No acute abnormality noted. Electronically Signed   By: Alcide Clever M.D.   On: 10/18/2023 03:16     Time coordinating discharge: Over 30 minutes    Lewie Chamber, MD  [facility] 10/24/2023, 1:17 PM

## 2023-10-24 NOTE — TOC Transition Note (Signed)
 Transition of Care [facility]) - Discharge Note   Patient Details  Name: [name] MRN: [id number] Date of Birth: Jul 09, 1946  Transition of Care [facility]) CM/SW Contact:  Otelia Santee, LCSW Phone Number: 10/24/2023, 12:16 PM   Clinical Narrative:    Pt to return home with home health PT/OT through [facility]. RW delivered to room by [employer]. No further TOC needs identified.   Final next level of care: Home w Home Health Services Barriers to Discharge: Barriers Resolved   Patient Goals and CMS Choice Patient states their goals for this hospitalization and ongoing recovery are:: return home w/ home health CMS Medicare.gov Compare Post Acute Care list provided to:: Patient Choice offered to / list presented to : Patient [place] ownership interest in [facility].provided to:: Patient    Discharge Placement                       Discharge Plan and Services Additional resources added to the After Visit Summary for       Post Acute Care Choice: Durable Medical Equipment          DME Arranged: Dan Humphreys rolling DME Agency: [employer] Date DME Agency Contacted: 10/19/23 Time DME Agency Contacted: [id number] Representative spoke with at DME Agency: Vaughan Basta HH Arranged: PT, OT HH Agency: [facility] Date [facility] Agency Contacted: 10/22/23 Time HH Agency Contacted: 1153 Representative spoke with at [facility] Agency: Clifton Custard  Social Drivers of Health (SDOH) Interventions SDOH Screenings   Food Insecurity: No Food Insecurity (10/18/2023)  Housing: Low Risk  (10/18/2023)  Transportation Needs: No Transportation Needs (10/22/2023)  Recent Concern: Transportation Needs - Unmet Transportation Needs (10/18/2023)  Utilities: Not At Risk (10/18/2023)  Alcohol Screen: Low Risk  (10/22/2023)  Social Connections: Socially Isolated (10/22/2023)  Tobacco Use: Low Risk  (10/18/2023)     Readmission Risk Interventions     No data to display

## 2023-10-24 NOTE — Plan of Care (Signed)

## 2023-10-25 DIAGNOSIS — E785 Hyperlipidemia, unspecified: Secondary | ICD-10-CM | POA: Diagnosis not present

## 2023-10-25 DIAGNOSIS — E871 Hypo-osmolality and hyponatremia: Secondary | ICD-10-CM | POA: Diagnosis not present

## 2023-10-25 DIAGNOSIS — M48061 Spinal stenosis, lumbar region without neurogenic claudication: Secondary | ICD-10-CM | POA: Diagnosis not present

## 2023-10-25 DIAGNOSIS — M1711 Unilateral primary osteoarthritis, right knee: Secondary | ICD-10-CM | POA: Diagnosis not present

## 2023-10-25 DIAGNOSIS — Z9181 History of falling: Secondary | ICD-10-CM | POA: Diagnosis not present

## 2023-10-25 DIAGNOSIS — I10 Essential (primary) hypertension: Secondary | ICD-10-CM | POA: Diagnosis not present

## 2023-10-25 DIAGNOSIS — E876 Hypokalemia: Secondary | ICD-10-CM | POA: Diagnosis not present

## 2023-10-25 DIAGNOSIS — D649 Anemia, unspecified: Secondary | ICD-10-CM | POA: Diagnosis not present

## 2023-10-25 DIAGNOSIS — N179 Acute kidney failure, unspecified: Secondary | ICD-10-CM | POA: Diagnosis not present

## 2023-10-25 DIAGNOSIS — E119 Type 2 diabetes mellitus without complications: Secondary | ICD-10-CM | POA: Diagnosis not present

## 2023-10-25 DIAGNOSIS — M51369 Other intervertebral disc degeneration, lumbar region without mention of lumbar back pain or lower extremity pain: Secondary | ICD-10-CM | POA: Diagnosis not present

## 2023-10-25 DIAGNOSIS — Z556 Problems related to health literacy: Secondary | ICD-10-CM | POA: Diagnosis not present

## 2023-10-25 DIAGNOSIS — E8721 Acute metabolic acidosis: Secondary | ICD-10-CM | POA: Diagnosis not present

## 2023-10-25 DIAGNOSIS — E441 Mild protein-calorie malnutrition: Secondary | ICD-10-CM | POA: Diagnosis not present

## 2023-10-29 DIAGNOSIS — I10 Essential (primary) hypertension: Secondary | ICD-10-CM | POA: Diagnosis not present

## 2023-10-29 DIAGNOSIS — E1169 Type 2 diabetes mellitus with other specified complication: Secondary | ICD-10-CM | POA: Diagnosis not present

## 2023-10-29 DIAGNOSIS — E7800 Pure hypercholesterolemia, unspecified: Secondary | ICD-10-CM | POA: Diagnosis not present

## 2023-10-30 DIAGNOSIS — E119 Type 2 diabetes mellitus without complications: Secondary | ICD-10-CM | POA: Diagnosis not present

## 2023-10-30 DIAGNOSIS — M1711 Unilateral primary osteoarthritis, right knee: Secondary | ICD-10-CM | POA: Diagnosis not present

## 2023-10-30 DIAGNOSIS — I10 Essential (primary) hypertension: Secondary | ICD-10-CM | POA: Diagnosis not present

## 2023-10-30 DIAGNOSIS — E8721 Acute metabolic acidosis: Secondary | ICD-10-CM | POA: Diagnosis not present

## 2023-10-30 DIAGNOSIS — D649 Anemia, unspecified: Secondary | ICD-10-CM | POA: Diagnosis not present

## 2023-10-30 DIAGNOSIS — M51369 Other intervertebral disc degeneration, lumbar region without mention of lumbar back pain or lower extremity pain: Secondary | ICD-10-CM | POA: Diagnosis not present

## 2023-10-30 DIAGNOSIS — E785 Hyperlipidemia, unspecified: Secondary | ICD-10-CM | POA: Diagnosis not present

## 2023-10-30 DIAGNOSIS — Z9181 History of falling: Secondary | ICD-10-CM | POA: Diagnosis not present

## 2023-10-30 DIAGNOSIS — N179 Acute kidney failure, unspecified: Secondary | ICD-10-CM | POA: Diagnosis not present

## 2023-10-30 DIAGNOSIS — E876 Hypokalemia: Secondary | ICD-10-CM | POA: Diagnosis not present

## 2023-10-30 DIAGNOSIS — M48061 Spinal stenosis, lumbar region without neurogenic claudication: Secondary | ICD-10-CM | POA: Diagnosis not present

## 2023-10-30 DIAGNOSIS — E871 Hypo-osmolality and hyponatremia: Secondary | ICD-10-CM | POA: Diagnosis not present

## 2023-10-30 DIAGNOSIS — E441 Mild protein-calorie malnutrition: Secondary | ICD-10-CM | POA: Diagnosis not present

## 2023-10-30 DIAGNOSIS — Z556 Problems related to health literacy: Secondary | ICD-10-CM | POA: Diagnosis not present

## 2023-11-01 DIAGNOSIS — M51369 Other intervertebral disc degeneration, lumbar region without mention of lumbar back pain or lower extremity pain: Secondary | ICD-10-CM | POA: Diagnosis not present

## 2023-11-01 DIAGNOSIS — D649 Anemia, unspecified: Secondary | ICD-10-CM | POA: Diagnosis not present

## 2023-11-01 DIAGNOSIS — E119 Type 2 diabetes mellitus without complications: Secondary | ICD-10-CM | POA: Diagnosis not present

## 2023-11-01 DIAGNOSIS — Z9181 History of falling: Secondary | ICD-10-CM | POA: Diagnosis not present

## 2023-11-01 DIAGNOSIS — Z556 Problems related to health literacy: Secondary | ICD-10-CM | POA: Diagnosis not present

## 2023-11-01 DIAGNOSIS — M48061 Spinal stenosis, lumbar region without neurogenic claudication: Secondary | ICD-10-CM | POA: Diagnosis not present

## 2023-11-01 DIAGNOSIS — N179 Acute kidney failure, unspecified: Secondary | ICD-10-CM | POA: Diagnosis not present

## 2023-11-01 DIAGNOSIS — E8721 Acute metabolic acidosis: Secondary | ICD-10-CM | POA: Diagnosis not present

## 2023-11-01 DIAGNOSIS — E871 Hypo-osmolality and hyponatremia: Secondary | ICD-10-CM | POA: Diagnosis not present

## 2023-11-01 DIAGNOSIS — E785 Hyperlipidemia, unspecified: Secondary | ICD-10-CM | POA: Diagnosis not present

## 2023-11-01 DIAGNOSIS — E876 Hypokalemia: Secondary | ICD-10-CM | POA: Diagnosis not present

## 2023-11-01 DIAGNOSIS — M1711 Unilateral primary osteoarthritis, right knee: Secondary | ICD-10-CM | POA: Diagnosis not present

## 2023-11-01 DIAGNOSIS — I10 Essential (primary) hypertension: Secondary | ICD-10-CM | POA: Diagnosis not present

## 2023-11-01 DIAGNOSIS — E441 Mild protein-calorie malnutrition: Secondary | ICD-10-CM | POA: Diagnosis not present

## 2023-11-05 DIAGNOSIS — M51369 Other intervertebral disc degeneration, lumbar region without mention of lumbar back pain or lower extremity pain: Secondary | ICD-10-CM | POA: Diagnosis not present

## 2023-11-05 DIAGNOSIS — M1711 Unilateral primary osteoarthritis, right knee: Secondary | ICD-10-CM | POA: Diagnosis not present

## 2023-11-05 DIAGNOSIS — E876 Hypokalemia: Secondary | ICD-10-CM | POA: Diagnosis not present

## 2023-11-05 DIAGNOSIS — Z9181 History of falling: Secondary | ICD-10-CM | POA: Diagnosis not present

## 2023-11-05 DIAGNOSIS — E785 Hyperlipidemia, unspecified: Secondary | ICD-10-CM | POA: Diagnosis not present

## 2023-11-05 DIAGNOSIS — E8721 Acute metabolic acidosis: Secondary | ICD-10-CM | POA: Diagnosis not present

## 2023-11-05 DIAGNOSIS — E119 Type 2 diabetes mellitus without complications: Secondary | ICD-10-CM | POA: Diagnosis not present

## 2023-11-05 DIAGNOSIS — E871 Hypo-osmolality and hyponatremia: Secondary | ICD-10-CM | POA: Diagnosis not present

## 2023-11-05 DIAGNOSIS — D649 Anemia, unspecified: Secondary | ICD-10-CM | POA: Diagnosis not present

## 2023-11-05 DIAGNOSIS — Z556 Problems related to health literacy: Secondary | ICD-10-CM | POA: Diagnosis not present

## 2023-11-05 DIAGNOSIS — M48061 Spinal stenosis, lumbar region without neurogenic claudication: Secondary | ICD-10-CM | POA: Diagnosis not present

## 2023-11-05 DIAGNOSIS — I10 Essential (primary) hypertension: Secondary | ICD-10-CM | POA: Diagnosis not present

## 2023-11-05 DIAGNOSIS — E441 Mild protein-calorie malnutrition: Secondary | ICD-10-CM | POA: Diagnosis not present

## 2023-11-05 DIAGNOSIS — N179 Acute kidney failure, unspecified: Secondary | ICD-10-CM | POA: Diagnosis not present

## 2023-11-07 DIAGNOSIS — E119 Type 2 diabetes mellitus without complications: Secondary | ICD-10-CM | POA: Diagnosis not present

## 2023-11-07 DIAGNOSIS — N179 Acute kidney failure, unspecified: Secondary | ICD-10-CM | POA: Diagnosis not present

## 2023-11-07 DIAGNOSIS — D649 Anemia, unspecified: Secondary | ICD-10-CM | POA: Diagnosis not present

## 2023-11-07 DIAGNOSIS — M48061 Spinal stenosis, lumbar region without neurogenic claudication: Secondary | ICD-10-CM | POA: Diagnosis not present

## 2023-11-07 DIAGNOSIS — M51369 Other intervertebral disc degeneration, lumbar region without mention of lumbar back pain or lower extremity pain: Secondary | ICD-10-CM | POA: Diagnosis not present

## 2023-11-07 DIAGNOSIS — E876 Hypokalemia: Secondary | ICD-10-CM | POA: Diagnosis not present

## 2023-11-07 DIAGNOSIS — E785 Hyperlipidemia, unspecified: Secondary | ICD-10-CM | POA: Diagnosis not present

## 2023-11-07 DIAGNOSIS — E871 Hypo-osmolality and hyponatremia: Secondary | ICD-10-CM | POA: Diagnosis not present

## 2023-11-07 DIAGNOSIS — M1711 Unilateral primary osteoarthritis, right knee: Secondary | ICD-10-CM | POA: Diagnosis not present

## 2023-11-07 DIAGNOSIS — I10 Essential (primary) hypertension: Secondary | ICD-10-CM | POA: Diagnosis not present

## 2023-11-07 DIAGNOSIS — Z9181 History of falling: Secondary | ICD-10-CM | POA: Diagnosis not present

## 2023-11-07 DIAGNOSIS — Z556 Problems related to health literacy: Secondary | ICD-10-CM | POA: Diagnosis not present

## 2023-11-07 DIAGNOSIS — E8721 Acute metabolic acidosis: Secondary | ICD-10-CM | POA: Diagnosis not present

## 2023-11-07 DIAGNOSIS — E441 Mild protein-calorie malnutrition: Secondary | ICD-10-CM | POA: Diagnosis not present

## 2023-11-12 DIAGNOSIS — E441 Mild protein-calorie malnutrition: Secondary | ICD-10-CM | POA: Diagnosis not present

## 2023-11-12 DIAGNOSIS — D649 Anemia, unspecified: Secondary | ICD-10-CM | POA: Diagnosis not present

## 2023-11-12 DIAGNOSIS — I10 Essential (primary) hypertension: Secondary | ICD-10-CM | POA: Diagnosis not present

## 2023-11-12 DIAGNOSIS — E785 Hyperlipidemia, unspecified: Secondary | ICD-10-CM | POA: Diagnosis not present

## 2023-11-12 DIAGNOSIS — Z9181 History of falling: Secondary | ICD-10-CM | POA: Diagnosis not present

## 2023-11-12 DIAGNOSIS — E8721 Acute metabolic acidosis: Secondary | ICD-10-CM | POA: Diagnosis not present

## 2023-11-12 DIAGNOSIS — E119 Type 2 diabetes mellitus without complications: Secondary | ICD-10-CM | POA: Diagnosis not present

## 2023-11-12 DIAGNOSIS — M1711 Unilateral primary osteoarthritis, right knee: Secondary | ICD-10-CM | POA: Diagnosis not present

## 2023-11-12 DIAGNOSIS — Z556 Problems related to health literacy: Secondary | ICD-10-CM | POA: Diagnosis not present

## 2023-11-12 DIAGNOSIS — M48061 Spinal stenosis, lumbar region without neurogenic claudication: Secondary | ICD-10-CM | POA: Diagnosis not present

## 2023-11-12 DIAGNOSIS — N179 Acute kidney failure, unspecified: Secondary | ICD-10-CM | POA: Diagnosis not present

## 2023-11-12 DIAGNOSIS — M51369 Other intervertebral disc degeneration, lumbar region without mention of lumbar back pain or lower extremity pain: Secondary | ICD-10-CM | POA: Diagnosis not present

## 2023-11-12 DIAGNOSIS — E876 Hypokalemia: Secondary | ICD-10-CM | POA: Diagnosis not present

## 2023-11-12 DIAGNOSIS — E871 Hypo-osmolality and hyponatremia: Secondary | ICD-10-CM | POA: Diagnosis not present

## 2023-11-14 DIAGNOSIS — E785 Hyperlipidemia, unspecified: Secondary | ICD-10-CM | POA: Diagnosis not present

## 2023-11-14 DIAGNOSIS — M51369 Other intervertebral disc degeneration, lumbar region without mention of lumbar back pain or lower extremity pain: Secondary | ICD-10-CM | POA: Diagnosis not present

## 2023-11-14 DIAGNOSIS — Z9181 History of falling: Secondary | ICD-10-CM | POA: Diagnosis not present

## 2023-11-14 DIAGNOSIS — D649 Anemia, unspecified: Secondary | ICD-10-CM | POA: Diagnosis not present

## 2023-11-14 DIAGNOSIS — E441 Mild protein-calorie malnutrition: Secondary | ICD-10-CM | POA: Diagnosis not present

## 2023-11-14 DIAGNOSIS — E8721 Acute metabolic acidosis: Secondary | ICD-10-CM | POA: Diagnosis not present

## 2023-11-14 DIAGNOSIS — I10 Essential (primary) hypertension: Secondary | ICD-10-CM | POA: Diagnosis not present

## 2023-11-14 DIAGNOSIS — M48061 Spinal stenosis, lumbar region without neurogenic claudication: Secondary | ICD-10-CM | POA: Diagnosis not present

## 2023-11-14 DIAGNOSIS — Z556 Problems related to health literacy: Secondary | ICD-10-CM | POA: Diagnosis not present

## 2023-11-14 DIAGNOSIS — N179 Acute kidney failure, unspecified: Secondary | ICD-10-CM | POA: Diagnosis not present

## 2023-11-14 DIAGNOSIS — E119 Type 2 diabetes mellitus without complications: Secondary | ICD-10-CM | POA: Diagnosis not present

## 2023-11-14 DIAGNOSIS — E876 Hypokalemia: Secondary | ICD-10-CM | POA: Diagnosis not present

## 2023-11-14 DIAGNOSIS — E871 Hypo-osmolality and hyponatremia: Secondary | ICD-10-CM | POA: Diagnosis not present

## 2023-11-14 DIAGNOSIS — M1711 Unilateral primary osteoarthritis, right knee: Secondary | ICD-10-CM | POA: Diagnosis not present

## 2023-11-18 DIAGNOSIS — Z9181 History of falling: Secondary | ICD-10-CM | POA: Diagnosis not present

## 2023-11-18 DIAGNOSIS — E8721 Acute metabolic acidosis: Secondary | ICD-10-CM | POA: Diagnosis not present

## 2023-11-18 DIAGNOSIS — Z556 Problems related to health literacy: Secondary | ICD-10-CM | POA: Diagnosis not present

## 2023-11-18 DIAGNOSIS — M1711 Unilateral primary osteoarthritis, right knee: Secondary | ICD-10-CM | POA: Diagnosis not present

## 2023-11-18 DIAGNOSIS — E876 Hypokalemia: Secondary | ICD-10-CM | POA: Diagnosis not present

## 2023-11-18 DIAGNOSIS — N179 Acute kidney failure, unspecified: Secondary | ICD-10-CM | POA: Diagnosis not present

## 2023-11-18 DIAGNOSIS — E871 Hypo-osmolality and hyponatremia: Secondary | ICD-10-CM | POA: Diagnosis not present

## 2023-11-18 DIAGNOSIS — E119 Type 2 diabetes mellitus without complications: Secondary | ICD-10-CM | POA: Diagnosis not present

## 2023-11-18 DIAGNOSIS — E785 Hyperlipidemia, unspecified: Secondary | ICD-10-CM | POA: Diagnosis not present

## 2023-11-18 DIAGNOSIS — M48061 Spinal stenosis, lumbar region without neurogenic claudication: Secondary | ICD-10-CM | POA: Diagnosis not present

## 2023-11-18 DIAGNOSIS — D649 Anemia, unspecified: Secondary | ICD-10-CM | POA: Diagnosis not present

## 2023-11-18 DIAGNOSIS — E441 Mild protein-calorie malnutrition: Secondary | ICD-10-CM | POA: Diagnosis not present

## 2023-11-18 DIAGNOSIS — I10 Essential (primary) hypertension: Secondary | ICD-10-CM | POA: Diagnosis not present

## 2023-11-18 DIAGNOSIS — M51369 Other intervertebral disc degeneration, lumbar region without mention of lumbar back pain or lower extremity pain: Secondary | ICD-10-CM | POA: Diagnosis not present

## 2023-11-19 DIAGNOSIS — E119 Type 2 diabetes mellitus without complications: Secondary | ICD-10-CM | POA: Diagnosis not present

## 2023-11-19 DIAGNOSIS — M48061 Spinal stenosis, lumbar region without neurogenic claudication: Secondary | ICD-10-CM | POA: Diagnosis not present

## 2023-11-19 DIAGNOSIS — E876 Hypokalemia: Secondary | ICD-10-CM | POA: Diagnosis not present

## 2023-11-19 DIAGNOSIS — M1711 Unilateral primary osteoarthritis, right knee: Secondary | ICD-10-CM | POA: Diagnosis not present

## 2023-11-19 DIAGNOSIS — E871 Hypo-osmolality and hyponatremia: Secondary | ICD-10-CM | POA: Diagnosis not present

## 2023-11-19 DIAGNOSIS — N179 Acute kidney failure, unspecified: Secondary | ICD-10-CM | POA: Diagnosis not present

## 2023-11-19 DIAGNOSIS — Z556 Problems related to health literacy: Secondary | ICD-10-CM | POA: Diagnosis not present

## 2023-11-19 DIAGNOSIS — M51369 Other intervertebral disc degeneration, lumbar region without mention of lumbar back pain or lower extremity pain: Secondary | ICD-10-CM | POA: Diagnosis not present

## 2023-11-19 DIAGNOSIS — E8721 Acute metabolic acidosis: Secondary | ICD-10-CM | POA: Diagnosis not present

## 2023-11-19 DIAGNOSIS — Z9181 History of falling: Secondary | ICD-10-CM | POA: Diagnosis not present

## 2023-11-19 DIAGNOSIS — E785 Hyperlipidemia, unspecified: Secondary | ICD-10-CM | POA: Diagnosis not present

## 2023-11-19 DIAGNOSIS — D649 Anemia, unspecified: Secondary | ICD-10-CM | POA: Diagnosis not present

## 2023-11-19 DIAGNOSIS — I10 Essential (primary) hypertension: Secondary | ICD-10-CM | POA: Diagnosis not present

## 2023-11-19 DIAGNOSIS — E441 Mild protein-calorie malnutrition: Secondary | ICD-10-CM | POA: Diagnosis not present

## 2023-11-22 DIAGNOSIS — E1169 Type 2 diabetes mellitus with other specified complication: Secondary | ICD-10-CM | POA: Diagnosis not present

## 2023-11-22 DIAGNOSIS — R600 Localized edema: Secondary | ICD-10-CM | POA: Diagnosis not present

## 2023-11-22 DIAGNOSIS — I10 Essential (primary) hypertension: Secondary | ICD-10-CM | POA: Diagnosis not present

## 2023-11-22 DIAGNOSIS — E782 Mixed hyperlipidemia: Secondary | ICD-10-CM | POA: Diagnosis not present

## 2023-11-22 DIAGNOSIS — R413 Other amnesia: Secondary | ICD-10-CM | POA: Diagnosis not present

## 2023-11-26 DIAGNOSIS — E8721 Acute metabolic acidosis: Secondary | ICD-10-CM | POA: Diagnosis not present

## 2023-11-26 DIAGNOSIS — E871 Hypo-osmolality and hyponatremia: Secondary | ICD-10-CM | POA: Diagnosis not present

## 2023-11-26 DIAGNOSIS — M51369 Other intervertebral disc degeneration, lumbar region without mention of lumbar back pain or lower extremity pain: Secondary | ICD-10-CM | POA: Diagnosis not present

## 2023-11-26 DIAGNOSIS — E441 Mild protein-calorie malnutrition: Secondary | ICD-10-CM | POA: Diagnosis not present

## 2023-11-26 DIAGNOSIS — E119 Type 2 diabetes mellitus without complications: Secondary | ICD-10-CM | POA: Diagnosis not present

## 2023-11-26 DIAGNOSIS — M1711 Unilateral primary osteoarthritis, right knee: Secondary | ICD-10-CM | POA: Diagnosis not present

## 2023-11-26 DIAGNOSIS — E876 Hypokalemia: Secondary | ICD-10-CM | POA: Diagnosis not present

## 2023-11-26 DIAGNOSIS — Z556 Problems related to health literacy: Secondary | ICD-10-CM | POA: Diagnosis not present

## 2023-11-26 DIAGNOSIS — N179 Acute kidney failure, unspecified: Secondary | ICD-10-CM | POA: Diagnosis not present

## 2023-11-26 DIAGNOSIS — M48061 Spinal stenosis, lumbar region without neurogenic claudication: Secondary | ICD-10-CM | POA: Diagnosis not present

## 2023-11-26 DIAGNOSIS — I10 Essential (primary) hypertension: Secondary | ICD-10-CM | POA: Diagnosis not present

## 2023-11-26 DIAGNOSIS — Z9181 History of falling: Secondary | ICD-10-CM | POA: Diagnosis not present

## 2023-11-26 DIAGNOSIS — D649 Anemia, unspecified: Secondary | ICD-10-CM | POA: Diagnosis not present

## 2023-11-26 DIAGNOSIS — E785 Hyperlipidemia, unspecified: Secondary | ICD-10-CM | POA: Diagnosis not present

## 2023-11-28 DIAGNOSIS — Z9181 History of falling: Secondary | ICD-10-CM | POA: Diagnosis not present

## 2023-11-28 DIAGNOSIS — M48061 Spinal stenosis, lumbar region without neurogenic claudication: Secondary | ICD-10-CM | POA: Diagnosis not present

## 2023-11-28 DIAGNOSIS — Z556 Problems related to health literacy: Secondary | ICD-10-CM | POA: Diagnosis not present

## 2023-11-28 DIAGNOSIS — I10 Essential (primary) hypertension: Secondary | ICD-10-CM | POA: Diagnosis not present

## 2023-11-28 DIAGNOSIS — N179 Acute kidney failure, unspecified: Secondary | ICD-10-CM | POA: Diagnosis not present

## 2023-11-28 DIAGNOSIS — E876 Hypokalemia: Secondary | ICD-10-CM | POA: Diagnosis not present

## 2023-11-28 DIAGNOSIS — E119 Type 2 diabetes mellitus without complications: Secondary | ICD-10-CM | POA: Diagnosis not present

## 2023-11-28 DIAGNOSIS — E441 Mild protein-calorie malnutrition: Secondary | ICD-10-CM | POA: Diagnosis not present

## 2023-11-28 DIAGNOSIS — E8721 Acute metabolic acidosis: Secondary | ICD-10-CM | POA: Diagnosis not present

## 2023-11-28 DIAGNOSIS — M1711 Unilateral primary osteoarthritis, right knee: Secondary | ICD-10-CM | POA: Diagnosis not present

## 2023-11-28 DIAGNOSIS — E871 Hypo-osmolality and hyponatremia: Secondary | ICD-10-CM | POA: Diagnosis not present

## 2023-11-28 DIAGNOSIS — E785 Hyperlipidemia, unspecified: Secondary | ICD-10-CM | POA: Diagnosis not present

## 2023-11-28 DIAGNOSIS — D649 Anemia, unspecified: Secondary | ICD-10-CM | POA: Diagnosis not present

## 2023-11-28 DIAGNOSIS — M51369 Other intervertebral disc degeneration, lumbar region without mention of lumbar back pain or lower extremity pain: Secondary | ICD-10-CM | POA: Diagnosis not present

## 2023-12-02 DIAGNOSIS — M48061 Spinal stenosis, lumbar region without neurogenic claudication: Secondary | ICD-10-CM | POA: Diagnosis not present

## 2023-12-02 DIAGNOSIS — Z9181 History of falling: Secondary | ICD-10-CM | POA: Diagnosis not present

## 2023-12-02 DIAGNOSIS — M1711 Unilateral primary osteoarthritis, right knee: Secondary | ICD-10-CM | POA: Diagnosis not present

## 2023-12-02 DIAGNOSIS — E441 Mild protein-calorie malnutrition: Secondary | ICD-10-CM | POA: Diagnosis not present

## 2023-12-02 DIAGNOSIS — Z556 Problems related to health literacy: Secondary | ICD-10-CM | POA: Diagnosis not present

## 2023-12-02 DIAGNOSIS — E8721 Acute metabolic acidosis: Secondary | ICD-10-CM | POA: Diagnosis not present

## 2023-12-02 DIAGNOSIS — E785 Hyperlipidemia, unspecified: Secondary | ICD-10-CM | POA: Diagnosis not present

## 2023-12-02 DIAGNOSIS — D649 Anemia, unspecified: Secondary | ICD-10-CM | POA: Diagnosis not present

## 2023-12-02 DIAGNOSIS — M51369 Other intervertebral disc degeneration, lumbar region without mention of lumbar back pain or lower extremity pain: Secondary | ICD-10-CM | POA: Diagnosis not present

## 2023-12-02 DIAGNOSIS — E876 Hypokalemia: Secondary | ICD-10-CM | POA: Diagnosis not present

## 2023-12-02 DIAGNOSIS — N179 Acute kidney failure, unspecified: Secondary | ICD-10-CM | POA: Diagnosis not present

## 2023-12-02 DIAGNOSIS — E119 Type 2 diabetes mellitus without complications: Secondary | ICD-10-CM | POA: Diagnosis not present

## 2023-12-02 DIAGNOSIS — I10 Essential (primary) hypertension: Secondary | ICD-10-CM | POA: Diagnosis not present

## 2023-12-02 DIAGNOSIS — E871 Hypo-osmolality and hyponatremia: Secondary | ICD-10-CM | POA: Diagnosis not present

## 2023-12-04 DIAGNOSIS — M1711 Unilateral primary osteoarthritis, right knee: Secondary | ICD-10-CM | POA: Diagnosis not present

## 2023-12-04 DIAGNOSIS — E785 Hyperlipidemia, unspecified: Secondary | ICD-10-CM | POA: Diagnosis not present

## 2023-12-04 DIAGNOSIS — I10 Essential (primary) hypertension: Secondary | ICD-10-CM | POA: Diagnosis not present

## 2023-12-04 DIAGNOSIS — E441 Mild protein-calorie malnutrition: Secondary | ICD-10-CM | POA: Diagnosis not present

## 2023-12-04 DIAGNOSIS — N179 Acute kidney failure, unspecified: Secondary | ICD-10-CM | POA: Diagnosis not present

## 2023-12-04 DIAGNOSIS — D649 Anemia, unspecified: Secondary | ICD-10-CM | POA: Diagnosis not present

## 2023-12-04 DIAGNOSIS — Z556 Problems related to health literacy: Secondary | ICD-10-CM | POA: Diagnosis not present

## 2023-12-04 DIAGNOSIS — E871 Hypo-osmolality and hyponatremia: Secondary | ICD-10-CM | POA: Diagnosis not present

## 2023-12-04 DIAGNOSIS — E119 Type 2 diabetes mellitus without complications: Secondary | ICD-10-CM | POA: Diagnosis not present

## 2023-12-04 DIAGNOSIS — M48061 Spinal stenosis, lumbar region without neurogenic claudication: Secondary | ICD-10-CM | POA: Diagnosis not present

## 2023-12-04 DIAGNOSIS — E876 Hypokalemia: Secondary | ICD-10-CM | POA: Diagnosis not present

## 2023-12-04 DIAGNOSIS — M51369 Other intervertebral disc degeneration, lumbar region without mention of lumbar back pain or lower extremity pain: Secondary | ICD-10-CM | POA: Diagnosis not present

## 2023-12-04 DIAGNOSIS — Z9181 History of falling: Secondary | ICD-10-CM | POA: Diagnosis not present

## 2023-12-04 DIAGNOSIS — E8721 Acute metabolic acidosis: Secondary | ICD-10-CM | POA: Diagnosis not present

## 2023-12-05 DIAGNOSIS — D649 Anemia, unspecified: Secondary | ICD-10-CM | POA: Diagnosis not present

## 2023-12-05 DIAGNOSIS — Z556 Problems related to health literacy: Secondary | ICD-10-CM | POA: Diagnosis not present

## 2023-12-05 DIAGNOSIS — E785 Hyperlipidemia, unspecified: Secondary | ICD-10-CM | POA: Diagnosis not present

## 2023-12-05 DIAGNOSIS — M1711 Unilateral primary osteoarthritis, right knee: Secondary | ICD-10-CM | POA: Diagnosis not present

## 2023-12-05 DIAGNOSIS — N179 Acute kidney failure, unspecified: Secondary | ICD-10-CM | POA: Diagnosis not present

## 2023-12-05 DIAGNOSIS — E119 Type 2 diabetes mellitus without complications: Secondary | ICD-10-CM | POA: Diagnosis not present

## 2023-12-05 DIAGNOSIS — E876 Hypokalemia: Secondary | ICD-10-CM | POA: Diagnosis not present

## 2023-12-05 DIAGNOSIS — M48061 Spinal stenosis, lumbar region without neurogenic claudication: Secondary | ICD-10-CM | POA: Diagnosis not present

## 2023-12-05 DIAGNOSIS — Z9181 History of falling: Secondary | ICD-10-CM | POA: Diagnosis not present

## 2023-12-05 DIAGNOSIS — E871 Hypo-osmolality and hyponatremia: Secondary | ICD-10-CM | POA: Diagnosis not present

## 2023-12-05 DIAGNOSIS — E8721 Acute metabolic acidosis: Secondary | ICD-10-CM | POA: Diagnosis not present

## 2023-12-05 DIAGNOSIS — I10 Essential (primary) hypertension: Secondary | ICD-10-CM | POA: Diagnosis not present

## 2023-12-05 DIAGNOSIS — E441 Mild protein-calorie malnutrition: Secondary | ICD-10-CM | POA: Diagnosis not present

## 2023-12-05 DIAGNOSIS — M51369 Other intervertebral disc degeneration, lumbar region without mention of lumbar back pain or lower extremity pain: Secondary | ICD-10-CM | POA: Diagnosis not present

## 2023-12-06 DIAGNOSIS — L89896 Pressure-induced deep tissue damage of other site: Secondary | ICD-10-CM | POA: Diagnosis not present

## 2023-12-06 DIAGNOSIS — E1169 Type 2 diabetes mellitus with other specified complication: Secondary | ICD-10-CM | POA: Diagnosis not present

## 2023-12-06 DIAGNOSIS — I10 Essential (primary) hypertension: Secondary | ICD-10-CM | POA: Diagnosis not present

## 2023-12-06 DIAGNOSIS — E7800 Pure hypercholesterolemia, unspecified: Secondary | ICD-10-CM | POA: Diagnosis not present

## 2023-12-06 DIAGNOSIS — L97811 Non-pressure chronic ulcer of other part of right lower leg limited to breakdown of skin: Secondary | ICD-10-CM | POA: Diagnosis not present

## 2023-12-10 DIAGNOSIS — E876 Hypokalemia: Secondary | ICD-10-CM | POA: Diagnosis not present

## 2023-12-10 DIAGNOSIS — M1711 Unilateral primary osteoarthritis, right knee: Secondary | ICD-10-CM | POA: Diagnosis not present

## 2023-12-10 DIAGNOSIS — M48061 Spinal stenosis, lumbar region without neurogenic claudication: Secondary | ICD-10-CM | POA: Diagnosis not present

## 2023-12-10 DIAGNOSIS — E119 Type 2 diabetes mellitus without complications: Secondary | ICD-10-CM | POA: Diagnosis not present

## 2023-12-10 DIAGNOSIS — E871 Hypo-osmolality and hyponatremia: Secondary | ICD-10-CM | POA: Diagnosis not present

## 2023-12-10 DIAGNOSIS — I10 Essential (primary) hypertension: Secondary | ICD-10-CM | POA: Diagnosis not present

## 2023-12-10 DIAGNOSIS — Z9181 History of falling: Secondary | ICD-10-CM | POA: Diagnosis not present

## 2023-12-10 DIAGNOSIS — M51369 Other intervertebral disc degeneration, lumbar region without mention of lumbar back pain or lower extremity pain: Secondary | ICD-10-CM | POA: Diagnosis not present

## 2023-12-10 DIAGNOSIS — N179 Acute kidney failure, unspecified: Secondary | ICD-10-CM | POA: Diagnosis not present

## 2023-12-10 DIAGNOSIS — E441 Mild protein-calorie malnutrition: Secondary | ICD-10-CM | POA: Diagnosis not present

## 2023-12-10 DIAGNOSIS — E785 Hyperlipidemia, unspecified: Secondary | ICD-10-CM | POA: Diagnosis not present

## 2023-12-10 DIAGNOSIS — E8721 Acute metabolic acidosis: Secondary | ICD-10-CM | POA: Diagnosis not present

## 2023-12-10 DIAGNOSIS — Z556 Problems related to health literacy: Secondary | ICD-10-CM | POA: Diagnosis not present

## 2023-12-10 DIAGNOSIS — D649 Anemia, unspecified: Secondary | ICD-10-CM | POA: Diagnosis not present

## 2023-12-12 DIAGNOSIS — E785 Hyperlipidemia, unspecified: Secondary | ICD-10-CM | POA: Diagnosis not present

## 2023-12-12 DIAGNOSIS — E441 Mild protein-calorie malnutrition: Secondary | ICD-10-CM | POA: Diagnosis not present

## 2023-12-12 DIAGNOSIS — D649 Anemia, unspecified: Secondary | ICD-10-CM | POA: Diagnosis not present

## 2023-12-12 DIAGNOSIS — E871 Hypo-osmolality and hyponatremia: Secondary | ICD-10-CM | POA: Diagnosis not present

## 2023-12-12 DIAGNOSIS — E8721 Acute metabolic acidosis: Secondary | ICD-10-CM | POA: Diagnosis not present

## 2023-12-12 DIAGNOSIS — L89896 Pressure-induced deep tissue damage of other site: Secondary | ICD-10-CM | POA: Diagnosis not present

## 2023-12-12 DIAGNOSIS — E876 Hypokalemia: Secondary | ICD-10-CM | POA: Diagnosis not present

## 2023-12-12 DIAGNOSIS — N179 Acute kidney failure, unspecified: Secondary | ICD-10-CM | POA: Diagnosis not present

## 2023-12-12 DIAGNOSIS — I10 Essential (primary) hypertension: Secondary | ICD-10-CM | POA: Diagnosis not present

## 2023-12-12 DIAGNOSIS — M1711 Unilateral primary osteoarthritis, right knee: Secondary | ICD-10-CM | POA: Diagnosis not present

## 2023-12-12 DIAGNOSIS — M48061 Spinal stenosis, lumbar region without neurogenic claudication: Secondary | ICD-10-CM | POA: Diagnosis not present

## 2023-12-12 DIAGNOSIS — M51369 Other intervertebral disc degeneration, lumbar region without mention of lumbar back pain or lower extremity pain: Secondary | ICD-10-CM | POA: Diagnosis not present

## 2023-12-12 DIAGNOSIS — Z556 Problems related to health literacy: Secondary | ICD-10-CM | POA: Diagnosis not present

## 2023-12-12 DIAGNOSIS — Z9181 History of falling: Secondary | ICD-10-CM | POA: Diagnosis not present

## 2023-12-12 DIAGNOSIS — E119 Type 2 diabetes mellitus without complications: Secondary | ICD-10-CM | POA: Diagnosis not present

## 2023-12-13 DIAGNOSIS — I10 Essential (primary) hypertension: Secondary | ICD-10-CM | POA: Diagnosis not present

## 2023-12-13 DIAGNOSIS — D649 Anemia, unspecified: Secondary | ICD-10-CM | POA: Diagnosis not present

## 2023-12-13 DIAGNOSIS — E1169 Type 2 diabetes mellitus with other specified complication: Secondary | ICD-10-CM | POA: Diagnosis not present

## 2023-12-13 DIAGNOSIS — L97811 Non-pressure chronic ulcer of other part of right lower leg limited to breakdown of skin: Secondary | ICD-10-CM | POA: Diagnosis not present

## 2023-12-16 DIAGNOSIS — L97919 Non-pressure chronic ulcer of unspecified part of right lower leg with unspecified severity: Secondary | ICD-10-CM | POA: Diagnosis not present

## 2023-12-16 DIAGNOSIS — I83018 Varicose veins of right lower extremity with ulcer other part of lower leg: Secondary | ICD-10-CM | POA: Diagnosis not present

## 2023-12-16 DIAGNOSIS — L89894 Pressure ulcer of other site, stage 4: Secondary | ICD-10-CM | POA: Diagnosis not present

## 2023-12-19 DIAGNOSIS — E785 Hyperlipidemia, unspecified: Secondary | ICD-10-CM | POA: Diagnosis not present

## 2023-12-19 DIAGNOSIS — M1711 Unilateral primary osteoarthritis, right knee: Secondary | ICD-10-CM | POA: Diagnosis not present

## 2023-12-19 DIAGNOSIS — E876 Hypokalemia: Secondary | ICD-10-CM | POA: Diagnosis not present

## 2023-12-19 DIAGNOSIS — I10 Essential (primary) hypertension: Secondary | ICD-10-CM | POA: Diagnosis not present

## 2023-12-19 DIAGNOSIS — I83012 Varicose veins of right lower extremity with ulcer of calf: Secondary | ICD-10-CM | POA: Diagnosis not present

## 2023-12-19 DIAGNOSIS — M51369 Other intervertebral disc degeneration, lumbar region without mention of lumbar back pain or lower extremity pain: Secondary | ICD-10-CM | POA: Diagnosis not present

## 2023-12-19 DIAGNOSIS — Z556 Problems related to health literacy: Secondary | ICD-10-CM | POA: Diagnosis not present

## 2023-12-19 DIAGNOSIS — Z9181 History of falling: Secondary | ICD-10-CM | POA: Diagnosis not present

## 2023-12-19 DIAGNOSIS — L89896 Pressure-induced deep tissue damage of other site: Secondary | ICD-10-CM | POA: Diagnosis not present

## 2023-12-19 DIAGNOSIS — M48061 Spinal stenosis, lumbar region without neurogenic claudication: Secondary | ICD-10-CM | POA: Diagnosis not present

## 2023-12-19 DIAGNOSIS — E871 Hypo-osmolality and hyponatremia: Secondary | ICD-10-CM | POA: Diagnosis not present

## 2023-12-19 DIAGNOSIS — D649 Anemia, unspecified: Secondary | ICD-10-CM | POA: Diagnosis not present

## 2023-12-19 DIAGNOSIS — E8721 Acute metabolic acidosis: Secondary | ICD-10-CM | POA: Diagnosis not present

## 2023-12-19 DIAGNOSIS — E119 Type 2 diabetes mellitus without complications: Secondary | ICD-10-CM | POA: Diagnosis not present

## 2023-12-19 DIAGNOSIS — N179 Acute kidney failure, unspecified: Secondary | ICD-10-CM | POA: Diagnosis not present

## 2023-12-19 DIAGNOSIS — E441 Mild protein-calorie malnutrition: Secondary | ICD-10-CM | POA: Diagnosis not present

## 2023-12-21 DIAGNOSIS — E441 Mild protein-calorie malnutrition: Secondary | ICD-10-CM | POA: Diagnosis not present

## 2023-12-21 DIAGNOSIS — Z9181 History of falling: Secondary | ICD-10-CM | POA: Diagnosis not present

## 2023-12-21 DIAGNOSIS — E119 Type 2 diabetes mellitus without complications: Secondary | ICD-10-CM | POA: Diagnosis not present

## 2023-12-21 DIAGNOSIS — M1711 Unilateral primary osteoarthritis, right knee: Secondary | ICD-10-CM | POA: Diagnosis not present

## 2023-12-21 DIAGNOSIS — M51369 Other intervertebral disc degeneration, lumbar region without mention of lumbar back pain or lower extremity pain: Secondary | ICD-10-CM | POA: Diagnosis not present

## 2023-12-21 DIAGNOSIS — I10 Essential (primary) hypertension: Secondary | ICD-10-CM | POA: Diagnosis not present

## 2023-12-21 DIAGNOSIS — E8721 Acute metabolic acidosis: Secondary | ICD-10-CM | POA: Diagnosis not present

## 2023-12-21 DIAGNOSIS — M48061 Spinal stenosis, lumbar region without neurogenic claudication: Secondary | ICD-10-CM | POA: Diagnosis not present

## 2023-12-21 DIAGNOSIS — Z556 Problems related to health literacy: Secondary | ICD-10-CM | POA: Diagnosis not present

## 2023-12-21 DIAGNOSIS — D649 Anemia, unspecified: Secondary | ICD-10-CM | POA: Diagnosis not present

## 2023-12-21 DIAGNOSIS — N179 Acute kidney failure, unspecified: Secondary | ICD-10-CM | POA: Diagnosis not present

## 2023-12-21 DIAGNOSIS — E871 Hypo-osmolality and hyponatremia: Secondary | ICD-10-CM | POA: Diagnosis not present

## 2023-12-21 DIAGNOSIS — E876 Hypokalemia: Secondary | ICD-10-CM | POA: Diagnosis not present

## 2023-12-21 DIAGNOSIS — E785 Hyperlipidemia, unspecified: Secondary | ICD-10-CM | POA: Diagnosis not present

## 2023-12-23 DIAGNOSIS — L89894 Pressure ulcer of other site, stage 4: Secondary | ICD-10-CM | POA: Diagnosis not present

## 2023-12-23 DIAGNOSIS — L97919 Non-pressure chronic ulcer of unspecified part of right lower leg with unspecified severity: Secondary | ICD-10-CM | POA: Diagnosis not present

## 2023-12-23 DIAGNOSIS — I83018 Varicose veins of right lower extremity with ulcer other part of lower leg: Secondary | ICD-10-CM | POA: Diagnosis not present

## 2023-12-25 DIAGNOSIS — E871 Hypo-osmolality and hyponatremia: Secondary | ICD-10-CM | POA: Diagnosis not present

## 2023-12-25 DIAGNOSIS — Z9181 History of falling: Secondary | ICD-10-CM | POA: Diagnosis not present

## 2023-12-25 DIAGNOSIS — M51369 Other intervertebral disc degeneration, lumbar region without mention of lumbar back pain or lower extremity pain: Secondary | ICD-10-CM | POA: Diagnosis not present

## 2023-12-25 DIAGNOSIS — I10 Essential (primary) hypertension: Secondary | ICD-10-CM | POA: Diagnosis not present

## 2023-12-25 DIAGNOSIS — N179 Acute kidney failure, unspecified: Secondary | ICD-10-CM | POA: Diagnosis not present

## 2023-12-25 DIAGNOSIS — E8721 Acute metabolic acidosis: Secondary | ICD-10-CM | POA: Diagnosis not present

## 2023-12-25 DIAGNOSIS — E119 Type 2 diabetes mellitus without complications: Secondary | ICD-10-CM | POA: Diagnosis not present

## 2023-12-25 DIAGNOSIS — Z556 Problems related to health literacy: Secondary | ICD-10-CM | POA: Diagnosis not present

## 2023-12-25 DIAGNOSIS — E441 Mild protein-calorie malnutrition: Secondary | ICD-10-CM | POA: Diagnosis not present

## 2023-12-25 DIAGNOSIS — M1711 Unilateral primary osteoarthritis, right knee: Secondary | ICD-10-CM | POA: Diagnosis not present

## 2023-12-25 DIAGNOSIS — E876 Hypokalemia: Secondary | ICD-10-CM | POA: Diagnosis not present

## 2023-12-25 DIAGNOSIS — E785 Hyperlipidemia, unspecified: Secondary | ICD-10-CM | POA: Diagnosis not present

## 2023-12-25 DIAGNOSIS — D649 Anemia, unspecified: Secondary | ICD-10-CM | POA: Diagnosis not present

## 2023-12-25 DIAGNOSIS — M48061 Spinal stenosis, lumbar region without neurogenic claudication: Secondary | ICD-10-CM | POA: Diagnosis not present

## 2023-12-25 DIAGNOSIS — L89896 Pressure-induced deep tissue damage of other site: Secondary | ICD-10-CM | POA: Diagnosis not present

## 2023-12-25 DIAGNOSIS — I83012 Varicose veins of right lower extremity with ulcer of calf: Secondary | ICD-10-CM | POA: Diagnosis not present

## 2023-12-27 DIAGNOSIS — R7309 Other abnormal glucose: Secondary | ICD-10-CM | POA: Diagnosis not present

## 2023-12-27 DIAGNOSIS — I10 Essential (primary) hypertension: Secondary | ICD-10-CM | POA: Diagnosis not present

## 2023-12-27 DIAGNOSIS — L97811 Non-pressure chronic ulcer of other part of right lower leg limited to breakdown of skin: Secondary | ICD-10-CM | POA: Diagnosis not present

## 2023-12-30 DIAGNOSIS — L97919 Non-pressure chronic ulcer of unspecified part of right lower leg with unspecified severity: Secondary | ICD-10-CM | POA: Diagnosis not present

## 2023-12-30 DIAGNOSIS — L89894 Pressure ulcer of other site, stage 4: Secondary | ICD-10-CM | POA: Diagnosis not present

## 2023-12-30 DIAGNOSIS — I83018 Varicose veins of right lower extremity with ulcer other part of lower leg: Secondary | ICD-10-CM | POA: Diagnosis not present

## 2024-01-01 DIAGNOSIS — I83012 Varicose veins of right lower extremity with ulcer of calf: Secondary | ICD-10-CM | POA: Diagnosis not present

## 2024-01-01 DIAGNOSIS — E871 Hypo-osmolality and hyponatremia: Secondary | ICD-10-CM | POA: Diagnosis not present

## 2024-01-01 DIAGNOSIS — E119 Type 2 diabetes mellitus without complications: Secondary | ICD-10-CM | POA: Diagnosis not present

## 2024-01-01 DIAGNOSIS — E785 Hyperlipidemia, unspecified: Secondary | ICD-10-CM | POA: Diagnosis not present

## 2024-01-01 DIAGNOSIS — E441 Mild protein-calorie malnutrition: Secondary | ICD-10-CM | POA: Diagnosis not present

## 2024-01-01 DIAGNOSIS — Z9181 History of falling: Secondary | ICD-10-CM | POA: Diagnosis not present

## 2024-01-01 DIAGNOSIS — Z556 Problems related to health literacy: Secondary | ICD-10-CM | POA: Diagnosis not present

## 2024-01-01 DIAGNOSIS — E876 Hypokalemia: Secondary | ICD-10-CM | POA: Diagnosis not present

## 2024-01-01 DIAGNOSIS — N179 Acute kidney failure, unspecified: Secondary | ICD-10-CM | POA: Diagnosis not present

## 2024-01-01 DIAGNOSIS — L89896 Pressure-induced deep tissue damage of other site: Secondary | ICD-10-CM | POA: Diagnosis not present

## 2024-01-01 DIAGNOSIS — M51369 Other intervertebral disc degeneration, lumbar region without mention of lumbar back pain or lower extremity pain: Secondary | ICD-10-CM | POA: Diagnosis not present

## 2024-01-01 DIAGNOSIS — M48061 Spinal stenosis, lumbar region without neurogenic claudication: Secondary | ICD-10-CM | POA: Diagnosis not present

## 2024-01-01 DIAGNOSIS — M1711 Unilateral primary osteoarthritis, right knee: Secondary | ICD-10-CM | POA: Diagnosis not present

## 2024-01-01 DIAGNOSIS — D649 Anemia, unspecified: Secondary | ICD-10-CM | POA: Diagnosis not present

## 2024-01-01 DIAGNOSIS — E8721 Acute metabolic acidosis: Secondary | ICD-10-CM | POA: Diagnosis not present

## 2024-01-01 DIAGNOSIS — I10 Essential (primary) hypertension: Secondary | ICD-10-CM | POA: Diagnosis not present

## 2024-01-07 DIAGNOSIS — I83018 Varicose veins of right lower extremity with ulcer other part of lower leg: Secondary | ICD-10-CM | POA: Diagnosis not present

## 2024-01-07 DIAGNOSIS — L89894 Pressure ulcer of other site, stage 4: Secondary | ICD-10-CM | POA: Diagnosis not present

## 2024-01-07 DIAGNOSIS — L97919 Non-pressure chronic ulcer of unspecified part of right lower leg with unspecified severity: Secondary | ICD-10-CM | POA: Diagnosis not present

## 2024-01-08 DIAGNOSIS — M1711 Unilateral primary osteoarthritis, right knee: Secondary | ICD-10-CM | POA: Diagnosis not present

## 2024-01-08 DIAGNOSIS — Z9181 History of falling: Secondary | ICD-10-CM | POA: Diagnosis not present

## 2024-01-08 DIAGNOSIS — E441 Mild protein-calorie malnutrition: Secondary | ICD-10-CM | POA: Diagnosis not present

## 2024-01-08 DIAGNOSIS — E785 Hyperlipidemia, unspecified: Secondary | ICD-10-CM | POA: Diagnosis not present

## 2024-01-08 DIAGNOSIS — E876 Hypokalemia: Secondary | ICD-10-CM | POA: Diagnosis not present

## 2024-01-08 DIAGNOSIS — L89896 Pressure-induced deep tissue damage of other site: Secondary | ICD-10-CM | POA: Diagnosis not present

## 2024-01-08 DIAGNOSIS — M48061 Spinal stenosis, lumbar region without neurogenic claudication: Secondary | ICD-10-CM | POA: Diagnosis not present

## 2024-01-08 DIAGNOSIS — E8721 Acute metabolic acidosis: Secondary | ICD-10-CM | POA: Diagnosis not present

## 2024-01-08 DIAGNOSIS — Z556 Problems related to health literacy: Secondary | ICD-10-CM | POA: Diagnosis not present

## 2024-01-08 DIAGNOSIS — N179 Acute kidney failure, unspecified: Secondary | ICD-10-CM | POA: Diagnosis not present

## 2024-01-08 DIAGNOSIS — I83012 Varicose veins of right lower extremity with ulcer of calf: Secondary | ICD-10-CM | POA: Diagnosis not present

## 2024-01-08 DIAGNOSIS — M51369 Other intervertebral disc degeneration, lumbar region without mention of lumbar back pain or lower extremity pain: Secondary | ICD-10-CM | POA: Diagnosis not present

## 2024-01-08 DIAGNOSIS — I10 Essential (primary) hypertension: Secondary | ICD-10-CM | POA: Diagnosis not present

## 2024-01-08 DIAGNOSIS — D649 Anemia, unspecified: Secondary | ICD-10-CM | POA: Diagnosis not present

## 2024-01-08 DIAGNOSIS — E119 Type 2 diabetes mellitus without complications: Secondary | ICD-10-CM | POA: Diagnosis not present

## 2024-01-08 DIAGNOSIS — E871 Hypo-osmolality and hyponatremia: Secondary | ICD-10-CM | POA: Diagnosis not present

## 2024-01-10 DIAGNOSIS — I10 Essential (primary) hypertension: Secondary | ICD-10-CM | POA: Diagnosis not present

## 2024-01-10 DIAGNOSIS — E119 Type 2 diabetes mellitus without complications: Secondary | ICD-10-CM | POA: Diagnosis not present

## 2024-01-10 DIAGNOSIS — E441 Mild protein-calorie malnutrition: Secondary | ICD-10-CM | POA: Diagnosis not present

## 2024-01-10 DIAGNOSIS — E8721 Acute metabolic acidosis: Secondary | ICD-10-CM | POA: Diagnosis not present

## 2024-01-10 DIAGNOSIS — Z556 Problems related to health literacy: Secondary | ICD-10-CM | POA: Diagnosis not present

## 2024-01-10 DIAGNOSIS — I83012 Varicose veins of right lower extremity with ulcer of calf: Secondary | ICD-10-CM | POA: Diagnosis not present

## 2024-01-10 DIAGNOSIS — D649 Anemia, unspecified: Secondary | ICD-10-CM | POA: Diagnosis not present

## 2024-01-10 DIAGNOSIS — M48061 Spinal stenosis, lumbar region without neurogenic claudication: Secondary | ICD-10-CM | POA: Diagnosis not present

## 2024-01-10 DIAGNOSIS — M1711 Unilateral primary osteoarthritis, right knee: Secondary | ICD-10-CM | POA: Diagnosis not present

## 2024-01-10 DIAGNOSIS — E876 Hypokalemia: Secondary | ICD-10-CM | POA: Diagnosis not present

## 2024-01-10 DIAGNOSIS — N179 Acute kidney failure, unspecified: Secondary | ICD-10-CM | POA: Diagnosis not present

## 2024-01-10 DIAGNOSIS — L89896 Pressure-induced deep tissue damage of other site: Secondary | ICD-10-CM | POA: Diagnosis not present

## 2024-01-10 DIAGNOSIS — Z9181 History of falling: Secondary | ICD-10-CM | POA: Diagnosis not present

## 2024-01-10 DIAGNOSIS — E871 Hypo-osmolality and hyponatremia: Secondary | ICD-10-CM | POA: Diagnosis not present

## 2024-01-10 DIAGNOSIS — M51369 Other intervertebral disc degeneration, lumbar region without mention of lumbar back pain or lower extremity pain: Secondary | ICD-10-CM | POA: Diagnosis not present

## 2024-01-10 DIAGNOSIS — E785 Hyperlipidemia, unspecified: Secondary | ICD-10-CM | POA: Diagnosis not present

## 2024-01-16 DIAGNOSIS — N179 Acute kidney failure, unspecified: Secondary | ICD-10-CM | POA: Diagnosis not present

## 2024-01-16 DIAGNOSIS — Z9181 History of falling: Secondary | ICD-10-CM | POA: Diagnosis not present

## 2024-01-16 DIAGNOSIS — E119 Type 2 diabetes mellitus without complications: Secondary | ICD-10-CM | POA: Diagnosis not present

## 2024-01-16 DIAGNOSIS — E785 Hyperlipidemia, unspecified: Secondary | ICD-10-CM | POA: Diagnosis not present

## 2024-01-16 DIAGNOSIS — I10 Essential (primary) hypertension: Secondary | ICD-10-CM | POA: Diagnosis not present

## 2024-01-16 DIAGNOSIS — E441 Mild protein-calorie malnutrition: Secondary | ICD-10-CM | POA: Diagnosis not present

## 2024-01-16 DIAGNOSIS — M48061 Spinal stenosis, lumbar region without neurogenic claudication: Secondary | ICD-10-CM | POA: Diagnosis not present

## 2024-01-16 DIAGNOSIS — E8721 Acute metabolic acidosis: Secondary | ICD-10-CM | POA: Diagnosis not present

## 2024-01-16 DIAGNOSIS — D649 Anemia, unspecified: Secondary | ICD-10-CM | POA: Diagnosis not present

## 2024-01-16 DIAGNOSIS — E876 Hypokalemia: Secondary | ICD-10-CM | POA: Diagnosis not present

## 2024-01-16 DIAGNOSIS — E871 Hypo-osmolality and hyponatremia: Secondary | ICD-10-CM | POA: Diagnosis not present

## 2024-01-16 DIAGNOSIS — I83012 Varicose veins of right lower extremity with ulcer of calf: Secondary | ICD-10-CM | POA: Diagnosis not present

## 2024-01-16 DIAGNOSIS — Z556 Problems related to health literacy: Secondary | ICD-10-CM | POA: Diagnosis not present

## 2024-01-16 DIAGNOSIS — M1711 Unilateral primary osteoarthritis, right knee: Secondary | ICD-10-CM | POA: Diagnosis not present

## 2024-01-16 DIAGNOSIS — L89896 Pressure-induced deep tissue damage of other site: Secondary | ICD-10-CM | POA: Diagnosis not present

## 2024-01-16 DIAGNOSIS — M51369 Other intervertebral disc degeneration, lumbar region without mention of lumbar back pain or lower extremity pain: Secondary | ICD-10-CM | POA: Diagnosis not present

## 2024-01-23 DIAGNOSIS — E119 Type 2 diabetes mellitus without complications: Secondary | ICD-10-CM | POA: Diagnosis not present

## 2024-01-23 DIAGNOSIS — E8721 Acute metabolic acidosis: Secondary | ICD-10-CM | POA: Diagnosis not present

## 2024-01-23 DIAGNOSIS — L89896 Pressure-induced deep tissue damage of other site: Secondary | ICD-10-CM | POA: Diagnosis not present

## 2024-01-23 DIAGNOSIS — E441 Mild protein-calorie malnutrition: Secondary | ICD-10-CM | POA: Diagnosis not present

## 2024-01-23 DIAGNOSIS — E871 Hypo-osmolality and hyponatremia: Secondary | ICD-10-CM | POA: Diagnosis not present

## 2024-01-23 DIAGNOSIS — M51369 Other intervertebral disc degeneration, lumbar region without mention of lumbar back pain or lower extremity pain: Secondary | ICD-10-CM | POA: Diagnosis not present

## 2024-01-23 DIAGNOSIS — D649 Anemia, unspecified: Secondary | ICD-10-CM | POA: Diagnosis not present

## 2024-01-23 DIAGNOSIS — I10 Essential (primary) hypertension: Secondary | ICD-10-CM | POA: Diagnosis not present

## 2024-01-23 DIAGNOSIS — M48061 Spinal stenosis, lumbar region without neurogenic claudication: Secondary | ICD-10-CM | POA: Diagnosis not present

## 2024-01-23 DIAGNOSIS — Z556 Problems related to health literacy: Secondary | ICD-10-CM | POA: Diagnosis not present

## 2024-01-23 DIAGNOSIS — E785 Hyperlipidemia, unspecified: Secondary | ICD-10-CM | POA: Diagnosis not present

## 2024-01-23 DIAGNOSIS — Z9181 History of falling: Secondary | ICD-10-CM | POA: Diagnosis not present

## 2024-01-23 DIAGNOSIS — E876 Hypokalemia: Secondary | ICD-10-CM | POA: Diagnosis not present

## 2024-01-23 DIAGNOSIS — I83012 Varicose veins of right lower extremity with ulcer of calf: Secondary | ICD-10-CM | POA: Diagnosis not present

## 2024-01-23 DIAGNOSIS — N179 Acute kidney failure, unspecified: Secondary | ICD-10-CM | POA: Diagnosis not present

## 2024-01-23 DIAGNOSIS — M1711 Unilateral primary osteoarthritis, right knee: Secondary | ICD-10-CM | POA: Diagnosis not present

## 2024-01-27 DIAGNOSIS — I10 Essential (primary) hypertension: Secondary | ICD-10-CM | POA: Diagnosis not present

## 2024-01-27 DIAGNOSIS — E1169 Type 2 diabetes mellitus with other specified complication: Secondary | ICD-10-CM | POA: Diagnosis not present

## 2024-01-27 DIAGNOSIS — L97811 Non-pressure chronic ulcer of other part of right lower leg limited to breakdown of skin: Secondary | ICD-10-CM | POA: Diagnosis not present

## 2024-01-27 DIAGNOSIS — E785 Hyperlipidemia, unspecified: Secondary | ICD-10-CM | POA: Diagnosis not present

## 2024-01-28 DIAGNOSIS — N179 Acute kidney failure, unspecified: Secondary | ICD-10-CM | POA: Diagnosis not present

## 2024-01-28 DIAGNOSIS — E8721 Acute metabolic acidosis: Secondary | ICD-10-CM | POA: Diagnosis not present

## 2024-01-28 DIAGNOSIS — E871 Hypo-osmolality and hyponatremia: Secondary | ICD-10-CM | POA: Diagnosis not present

## 2024-01-28 DIAGNOSIS — Z556 Problems related to health literacy: Secondary | ICD-10-CM | POA: Diagnosis not present

## 2024-01-28 DIAGNOSIS — M48061 Spinal stenosis, lumbar region without neurogenic claudication: Secondary | ICD-10-CM | POA: Diagnosis not present

## 2024-01-28 DIAGNOSIS — I10 Essential (primary) hypertension: Secondary | ICD-10-CM | POA: Diagnosis not present

## 2024-01-28 DIAGNOSIS — D649 Anemia, unspecified: Secondary | ICD-10-CM | POA: Diagnosis not present

## 2024-01-28 DIAGNOSIS — M51369 Other intervertebral disc degeneration, lumbar region without mention of lumbar back pain or lower extremity pain: Secondary | ICD-10-CM | POA: Diagnosis not present

## 2024-01-28 DIAGNOSIS — E785 Hyperlipidemia, unspecified: Secondary | ICD-10-CM | POA: Diagnosis not present

## 2024-01-28 DIAGNOSIS — I83012 Varicose veins of right lower extremity with ulcer of calf: Secondary | ICD-10-CM | POA: Diagnosis not present

## 2024-01-28 DIAGNOSIS — E441 Mild protein-calorie malnutrition: Secondary | ICD-10-CM | POA: Diagnosis not present

## 2024-01-28 DIAGNOSIS — Z9181 History of falling: Secondary | ICD-10-CM | POA: Diagnosis not present

## 2024-01-28 DIAGNOSIS — E119 Type 2 diabetes mellitus without complications: Secondary | ICD-10-CM | POA: Diagnosis not present

## 2024-01-28 DIAGNOSIS — E876 Hypokalemia: Secondary | ICD-10-CM | POA: Diagnosis not present

## 2024-01-28 DIAGNOSIS — M1711 Unilateral primary osteoarthritis, right knee: Secondary | ICD-10-CM | POA: Diagnosis not present

## 2024-01-28 DIAGNOSIS — L89896 Pressure-induced deep tissue damage of other site: Secondary | ICD-10-CM | POA: Diagnosis not present

## 2024-01-31 DIAGNOSIS — L97919 Non-pressure chronic ulcer of unspecified part of right lower leg with unspecified severity: Secondary | ICD-10-CM | POA: Diagnosis not present

## 2024-01-31 DIAGNOSIS — L89894 Pressure ulcer of other site, stage 4: Secondary | ICD-10-CM | POA: Diagnosis not present

## 2024-01-31 DIAGNOSIS — I83018 Varicose veins of right lower extremity with ulcer other part of lower leg: Secondary | ICD-10-CM | POA: Diagnosis not present

## 2024-02-05 DIAGNOSIS — D649 Anemia, unspecified: Secondary | ICD-10-CM | POA: Diagnosis not present

## 2024-02-05 DIAGNOSIS — Z9181 History of falling: Secondary | ICD-10-CM | POA: Diagnosis not present

## 2024-02-05 DIAGNOSIS — N179 Acute kidney failure, unspecified: Secondary | ICD-10-CM | POA: Diagnosis not present

## 2024-02-05 DIAGNOSIS — M48061 Spinal stenosis, lumbar region without neurogenic claudication: Secondary | ICD-10-CM | POA: Diagnosis not present

## 2024-02-05 DIAGNOSIS — M1711 Unilateral primary osteoarthritis, right knee: Secondary | ICD-10-CM | POA: Diagnosis not present

## 2024-02-05 DIAGNOSIS — E876 Hypokalemia: Secondary | ICD-10-CM | POA: Diagnosis not present

## 2024-02-05 DIAGNOSIS — E8721 Acute metabolic acidosis: Secondary | ICD-10-CM | POA: Diagnosis not present

## 2024-02-05 DIAGNOSIS — E871 Hypo-osmolality and hyponatremia: Secondary | ICD-10-CM | POA: Diagnosis not present

## 2024-02-05 DIAGNOSIS — M51369 Other intervertebral disc degeneration, lumbar region without mention of lumbar back pain or lower extremity pain: Secondary | ICD-10-CM | POA: Diagnosis not present

## 2024-02-05 DIAGNOSIS — E785 Hyperlipidemia, unspecified: Secondary | ICD-10-CM | POA: Diagnosis not present

## 2024-02-05 DIAGNOSIS — E119 Type 2 diabetes mellitus without complications: Secondary | ICD-10-CM | POA: Diagnosis not present

## 2024-02-05 DIAGNOSIS — Z556 Problems related to health literacy: Secondary | ICD-10-CM | POA: Diagnosis not present

## 2024-02-05 DIAGNOSIS — E441 Mild protein-calorie malnutrition: Secondary | ICD-10-CM | POA: Diagnosis not present

## 2024-02-05 DIAGNOSIS — I83012 Varicose veins of right lower extremity with ulcer of calf: Secondary | ICD-10-CM | POA: Diagnosis not present

## 2024-02-05 DIAGNOSIS — I10 Essential (primary) hypertension: Secondary | ICD-10-CM | POA: Diagnosis not present

## 2024-02-05 DIAGNOSIS — L89896 Pressure-induced deep tissue damage of other site: Secondary | ICD-10-CM | POA: Diagnosis not present

## 2024-02-10 DIAGNOSIS — E871 Hypo-osmolality and hyponatremia: Secondary | ICD-10-CM | POA: Diagnosis not present

## 2024-02-10 DIAGNOSIS — E119 Type 2 diabetes mellitus without complications: Secondary | ICD-10-CM | POA: Diagnosis not present

## 2024-02-10 DIAGNOSIS — M1711 Unilateral primary osteoarthritis, right knee: Secondary | ICD-10-CM | POA: Diagnosis not present

## 2024-02-10 DIAGNOSIS — N179 Acute kidney failure, unspecified: Secondary | ICD-10-CM | POA: Diagnosis not present

## 2024-02-10 DIAGNOSIS — E876 Hypokalemia: Secondary | ICD-10-CM | POA: Diagnosis not present

## 2024-02-10 DIAGNOSIS — Z556 Problems related to health literacy: Secondary | ICD-10-CM | POA: Diagnosis not present

## 2024-02-10 DIAGNOSIS — E441 Mild protein-calorie malnutrition: Secondary | ICD-10-CM | POA: Diagnosis not present

## 2024-02-10 DIAGNOSIS — L89896 Pressure-induced deep tissue damage of other site: Secondary | ICD-10-CM | POA: Diagnosis not present

## 2024-02-10 DIAGNOSIS — D649 Anemia, unspecified: Secondary | ICD-10-CM | POA: Diagnosis not present

## 2024-02-10 DIAGNOSIS — E785 Hyperlipidemia, unspecified: Secondary | ICD-10-CM | POA: Diagnosis not present

## 2024-02-10 DIAGNOSIS — I10 Essential (primary) hypertension: Secondary | ICD-10-CM | POA: Diagnosis not present

## 2024-02-10 DIAGNOSIS — E8721 Acute metabolic acidosis: Secondary | ICD-10-CM | POA: Diagnosis not present

## 2024-02-10 DIAGNOSIS — Z9181 History of falling: Secondary | ICD-10-CM | POA: Diagnosis not present

## 2024-02-10 DIAGNOSIS — I83012 Varicose veins of right lower extremity with ulcer of calf: Secondary | ICD-10-CM | POA: Diagnosis not present

## 2024-02-10 DIAGNOSIS — M51369 Other intervertebral disc degeneration, lumbar region without mention of lumbar back pain or lower extremity pain: Secondary | ICD-10-CM | POA: Diagnosis not present

## 2024-02-10 DIAGNOSIS — M48061 Spinal stenosis, lumbar region without neurogenic claudication: Secondary | ICD-10-CM | POA: Diagnosis not present

## 2024-02-17 DIAGNOSIS — Z9181 History of falling: Secondary | ICD-10-CM | POA: Diagnosis not present

## 2024-02-17 DIAGNOSIS — E871 Hypo-osmolality and hyponatremia: Secondary | ICD-10-CM | POA: Diagnosis not present

## 2024-02-17 DIAGNOSIS — D649 Anemia, unspecified: Secondary | ICD-10-CM | POA: Diagnosis not present

## 2024-02-17 DIAGNOSIS — M1711 Unilateral primary osteoarthritis, right knee: Secondary | ICD-10-CM | POA: Diagnosis not present

## 2024-02-17 DIAGNOSIS — E876 Hypokalemia: Secondary | ICD-10-CM | POA: Diagnosis not present

## 2024-02-17 DIAGNOSIS — I83012 Varicose veins of right lower extremity with ulcer of calf: Secondary | ICD-10-CM | POA: Diagnosis not present

## 2024-02-17 DIAGNOSIS — N179 Acute kidney failure, unspecified: Secondary | ICD-10-CM | POA: Diagnosis not present

## 2024-02-17 DIAGNOSIS — E8721 Acute metabolic acidosis: Secondary | ICD-10-CM | POA: Diagnosis not present

## 2024-02-17 DIAGNOSIS — Z556 Problems related to health literacy: Secondary | ICD-10-CM | POA: Diagnosis not present

## 2024-02-17 DIAGNOSIS — E119 Type 2 diabetes mellitus without complications: Secondary | ICD-10-CM | POA: Diagnosis not present

## 2024-02-17 DIAGNOSIS — M48061 Spinal stenosis, lumbar region without neurogenic claudication: Secondary | ICD-10-CM | POA: Diagnosis not present

## 2024-02-17 DIAGNOSIS — E785 Hyperlipidemia, unspecified: Secondary | ICD-10-CM | POA: Diagnosis not present

## 2024-02-17 DIAGNOSIS — E441 Mild protein-calorie malnutrition: Secondary | ICD-10-CM | POA: Diagnosis not present

## 2024-02-17 DIAGNOSIS — M51369 Other intervertebral disc degeneration, lumbar region without mention of lumbar back pain or lower extremity pain: Secondary | ICD-10-CM | POA: Diagnosis not present

## 2024-02-17 DIAGNOSIS — I10 Essential (primary) hypertension: Secondary | ICD-10-CM | POA: Diagnosis not present

## 2024-02-17 DIAGNOSIS — L89896 Pressure-induced deep tissue damage of other site: Secondary | ICD-10-CM | POA: Diagnosis not present

## 2024-03-09 DIAGNOSIS — Z91148 Patient's other noncompliance with medication regimen for other reason: Secondary | ICD-10-CM | POA: Diagnosis not present

## 2024-03-09 DIAGNOSIS — E1165 Type 2 diabetes mellitus with hyperglycemia: Secondary | ICD-10-CM | POA: Diagnosis not present

## 2024-03-09 DIAGNOSIS — E11622 Type 2 diabetes mellitus with other skin ulcer: Secondary | ICD-10-CM | POA: Diagnosis not present

## 2024-03-09 DIAGNOSIS — I10 Essential (primary) hypertension: Secondary | ICD-10-CM | POA: Diagnosis not present

## 2024-03-09 DIAGNOSIS — E7800 Pure hypercholesterolemia, unspecified: Secondary | ICD-10-CM | POA: Diagnosis not present

## 2024-04-29 ENCOUNTER — Ambulatory Visit (INDEPENDENT_AMBULATORY_CARE_PROVIDER_SITE_OTHER)

## 2024-04-29 DIAGNOSIS — E1165 Type 2 diabetes mellitus with hyperglycemia: Secondary | ICD-10-CM

## 2024-04-29 DIAGNOSIS — I872 Venous insufficiency (chronic) (peripheral): Secondary | ICD-10-CM | POA: Diagnosis not present

## 2024-04-29 DIAGNOSIS — E1142 Type 2 diabetes mellitus with diabetic polyneuropathy: Secondary | ICD-10-CM

## 2024-04-29 DIAGNOSIS — B351 Tinea unguium: Secondary | ICD-10-CM | POA: Diagnosis not present

## 2024-04-29 DIAGNOSIS — L97211 Non-pressure chronic ulcer of right calf limited to breakdown of skin: Secondary | ICD-10-CM | POA: Diagnosis not present

## 2024-04-29 NOTE — Progress Notes (Signed)
 Subjective:  Patient ID: [name], female    DOB: 1946/02/09,  MRN: [id number]  Chief Complaint  Patient presents with   Debridement    Requesting toenail trim - diabetic - last A1c was 6.2  Venous stasis ulcer posterior left right - was in treatment in [place] at wound center- completed, just wanted it checked today, dry, wearing compression stockings daily   New Patient (Initial Visit)    78 y.o. female presents for diabetic foot care. She recently healed a right posterolateral calf venous stasis ulcer. She states she has not had routine foot care in a significant amount of time. She admits to some tingling sensation but denies numbness bilaterally. Occasional cramping pain in legs bilaterally.   Review of Systems: Negative except as noted in the HPI. Denies N/V/F/Ch.  No past medical history on file.  Current Outpatient Medications:    losartan -hydrochlorothiazide (HYZAAR) 100-25 MG tablet, , Disp: , Rfl:    rosuvastatin (CRESTOR) 10 MG tablet, Take 10 mg by mouth at bedtime., Disp: , Rfl:    acetaminophen  (TYLENOL ) 500 MG tablet, Take 500-1,000 mg by mouth every 6 (six) hours as needed for mild pain (pain score 1-3) or headache (or dental pain)., Disp: , Rfl:    amLODipine  (NORVASC ) 10 MG tablet, Take 10 mg by mouth at bedtime., Disp: , Rfl:    Aspirin-Salicylamide-Caffeine (BC HEADACHE POWDER PO), Take 1 packet by mouth as needed (pain)., Disp: , Rfl:    indapamide (LOZOL) 1.25 MG tablet, Take 1.25 mg by mouth daily., Disp: , Rfl:    metFORMIN (GLUCOPHAGE) 500 MG tablet, Take 500 mg by mouth daily., Disp: , Rfl:    Multiple Vitamins-Minerals (ONE-A-DAY WOMENS PO), Take 1 tablet by mouth daily with breakfast., Disp: , Rfl:    nebivolol  (BYSTOLIC ) 10 MG tablet, Take 10 mg by mouth daily., Disp: , Rfl:    potassium chloride  (MICRO-K ) 10 MEQ CR capsule, Take 10 mEq by mouth daily., Disp: , Rfl:   Social History   Tobacco Use  Smoking Status Never  Smokeless Tobacco Never     Allergies  Allergen Reactions   Penicillins Rash   Objective:  There were no vitals filed for this visit. There is no height or weight on file to calculate BMI. Constitutional Well developed. Well nourished.  Vascular Dorsalis pedis pulses present 1+ bilaterally  Posterior tibial pulses present 1+ bilaterally  Pedal hair growth diminished. Capillary refill normal to all digits.  No cyanosis or clubbing noted.  Neurologic Normal speech. Oriented to person, place, and time. Epicritic sensation to light touch grossly present bilaterally. Protective sensation with 5.07 monofilament  present bilaterally. Vibratory sensation present bilaterally.  Dermatologic Nails elongated, thickened, dystrophic. Right posterolateral calf has healed venous stasis ulcer with new epithelial skin. 1.5cm x 1.5cm x superficial. No surrounding erythema, edema, signs of infection are noted.   Orthopedic: Normal joint ROM without pain or crepitus bilaterally. No visible deformities. No bony tenderness.     Assessment:   1. Onychomycosis   2. Venous stasis ulcer of right calf limited to breakdown of skin without varicose veins (HCC)   3. Type 2 diabetes mellitus with hyperglycemia, unspecified whether long term insulin  use (HCC)   4. Diabetic polyneuropathy associated with type 2 diabetes mellitus (HCC)    Plan:  Patient was evaluated and treated and all questions answered.  Diabetes with peripheral neuropathy and decreased pulses, Onychomycosis -Educated on diabetic footcare. Diabetic risk level moderate with well controlled A1C in setting of early peripheral  neuropathy and decreased pulses -Nails x10 debrided sharply and manually with large nail nipper and rotary burr.   Healed venous stasis ulcer of right calf - Discussed her venous stasis ulcers but she was previously seeing wound care for.  She was discharged after she healed it completely.  Today, it looks healthy with fresh epithelial skin.   No signs of infection. -Continue compression socks  Procedure: Nail Debridement Rationale: Patient meets criteria for routine foot care due to decreased pulses, neuropathy, trophic changes Type of Debridement: manual, sharp debridement. Instrumentation: Nail nipper, rotary burr. Number of Nails: 10   Follow-up with Dr. Nettie or Dr. Loreda in 3 months.  Prentice Ovens, DPM AACFAS Fellowship Trained Podiatric Surgeon [facility]

## 2024-04-30 DIAGNOSIS — G3184 Mild cognitive impairment, so stated: Secondary | ICD-10-CM | POA: Diagnosis not present

## 2024-04-30 DIAGNOSIS — I10 Essential (primary) hypertension: Secondary | ICD-10-CM | POA: Diagnosis not present

## 2024-04-30 DIAGNOSIS — E1165 Type 2 diabetes mellitus with hyperglycemia: Secondary | ICD-10-CM | POA: Diagnosis not present

## 2024-04-30 DIAGNOSIS — E7849 Other hyperlipidemia: Secondary | ICD-10-CM | POA: Diagnosis not present

## 2024-05-21 DIAGNOSIS — G3184 Mild cognitive impairment, so stated: Secondary | ICD-10-CM | POA: Diagnosis not present

## 2024-05-21 DIAGNOSIS — E7849 Other hyperlipidemia: Secondary | ICD-10-CM | POA: Diagnosis not present

## 2024-05-21 DIAGNOSIS — I739 Peripheral vascular disease, unspecified: Secondary | ICD-10-CM | POA: Diagnosis not present

## 2024-05-21 DIAGNOSIS — I119 Hypertensive heart disease without heart failure: Secondary | ICD-10-CM | POA: Diagnosis not present

## 2024-05-21 DIAGNOSIS — E1165 Type 2 diabetes mellitus with hyperglycemia: Secondary | ICD-10-CM | POA: Diagnosis not present

## 2024-05-21 DIAGNOSIS — R7309 Other abnormal glucose: Secondary | ICD-10-CM | POA: Diagnosis not present

## 2024-05-29 ENCOUNTER — Other Ambulatory Visit: Payer: Self-pay

## 2024-05-29 DIAGNOSIS — M79669 Pain in unspecified lower leg: Secondary | ICD-10-CM

## 2024-06-10 ENCOUNTER — Encounter: Payer: Self-pay | Admitting: *Deleted

## 2024-06-10 NOTE — Progress Notes (Signed)
 [name]                                          MRN: [id number]   06/10/2024   The [facility] Quality Team Specialist reviewed this patient medical record for the purposes of chart review for care gap closure. The following were reviewed: chart review for care gap closure-kidney health evaluation for diabetes:eGFR  and uACR.    VBCI Quality Team

## 2024-06-24 ENCOUNTER — Ambulatory Visit (HOSPITAL_COMMUNITY)
Admission: RE | Admit: 2024-06-24 | Discharge: 2024-06-24 | Disposition: A | Discharge status: Home / Self Care | Source: Ambulatory Visit | Attending: Vascular Surgery | Admitting: Vascular Surgery

## 2024-06-24 ENCOUNTER — Ambulatory Visit (INDEPENDENT_AMBULATORY_CARE_PROVIDER_SITE_OTHER): Admitting: Vascular Surgery

## 2024-06-24 ENCOUNTER — Encounter: Payer: Self-pay | Admitting: Vascular Surgery

## 2024-06-24 VITALS — BP 134/83 | HR 60 | Temp 98.3°F | Resp 20 | Ht 65.0 in | Wt 176.0 lb

## 2024-06-24 DIAGNOSIS — M79669 Pain in unspecified lower leg: Secondary | ICD-10-CM | POA: Diagnosis not present

## 2024-06-24 DIAGNOSIS — I739 Peripheral vascular disease, unspecified: Secondary | ICD-10-CM | POA: Insufficient documentation

## 2024-06-24 LAB — VAS US ABI WITH/WO TBI
Left ABI: 1.15
Right ABI: 1.04

## 2024-06-24 NOTE — Progress Notes (Signed)
 [facility] [place]  ASSESSMENT / PLAN: [name] is a 78 y.o. female with atherosclerosis of native arteries of bilateral lower extremities.  No typical claudication symptoms.  No chronic limb-threatening ischemia symptoms.  Recommend:  Abstinence from all tobacco products. Blood glucose control with goal A1c < 7%. Blood pressure control with goal blood pressure < 130/80 mmHg. Lipid reduction therapy with goal LDL-C < 55 mg/dL. Aspirin 81mg  by mouth daily. Atorvastatin 40-80mg  PO QD (or other high intensity statin therapy).  No role for intervention given her lack of symptoms and fairly mild disease on ankle-brachial index.  Will see her again in a year with an ankle-brachial index for surveillance.  CHIEF COMPLAINT: Cold feet at night  HISTORY OF PRESENT ILLNESS: [name] is a 78 y.o. female referred to clinic for evaluation of possible peripheral arterial disease.  The patient describes a cold sensation in her feet which is bothersome to her.  She also has nocturnal cramping in her legs.  She does not describe typical claudication symptoms.  She reports unlimited walking tolerance.  She does not describe any kind of rest pain symptoms in her feet.  She has no ulcers about her feet.  We reviewed the benign nature of these findings and I encouraged her to make daily walking a habit.  Past Medical History:  Diagnosis Date   Diabetes mellitus without complication (HCC)    Hyperlipidemia    Hypertension    Peripheral vascular disease     Past Surgical History:  Procedure Laterality Date   STONES REMOVED  2012    History reviewed. No pertinent family history.  Social History   Socioeconomic History   Marital status: Legally Separated    Spouse name: Not on file   Number of children: 5   Years of education: Not on file   Highest education level: Not on file  Occupational History   Not on file  Tobacco Use   Smoking status: Never    Smokeless tobacco: Never  Vaping Use   Vaping status: Never Used  Substance and Sexual Activity   Alcohol use: Yes    Alcohol/week: 14.0 standard drinks of alcohol    Types: 14 Shots of liquor per week   Drug use: Never   Sexual activity: Not Currently  Other Topics Concern   Not on file  Social History Narrative   Not on file   Social Drivers of Health   Financial Resource Strain: Not on file  Food Insecurity: No Food Insecurity (10/18/2023)   Hunger Vital Sign    Worried About Running Out of Food in the Last Year: Never true    Ran Out of Food in the Last Year: Never true  Transportation Needs: No Transportation Needs (10/22/2023)   PRAPARE - Administrator, Civil Service (Medical): No    Lack of Transportation (Non-Medical): No  Recent Concern: Transportation Needs - Unmet Transportation Needs (10/18/2023)   PRAPARE - Administrator, Civil Service (Medical): Yes    Lack of Transportation (Non-Medical): Yes  Physical Activity: Not on file  Stress: Not on file  Social Connections: Socially Isolated (10/22/2023)   Social Connection and Isolation Panel    Frequency of Communication with Friends and Family: More than three times a week    Frequency of Social Gatherings with Friends and Family: Twice a week    Attends Religious Services: Never    Database Administrator or Organizations: No  Attends Banker Meetings: Never    Marital Status: Separated  Intimate Partner Violence: Not At Risk (10/18/2023)   Humiliation, Afraid, Rape, and Kick questionnaire    Fear of Current or Ex-Partner: No    Emotionally Abused: No    Physically Abused: No    Sexually Abused: No    Allergies  Allergen Reactions   Penicillins Rash    Current Outpatient Medications  Medication Sig Dispense Refill   acetaminophen  (TYLENOL ) 500 MG tablet Take 500-1,000 mg by mouth every 6 (six) hours as needed for mild pain (pain score 1-3) or headache (or dental pain).      amLODipine  (NORVASC ) 10 MG tablet Take 10 mg by mouth at bedtime.     Aspirin-Salicylamide-Caffeine (BC HEADACHE POWDER PO) Take 1 packet by mouth as needed (pain).     indapamide (LOZOL) 1.25 MG tablet Take 1.25 mg by mouth daily.     losartan -hydrochlorothiazide (HYZAAR) 100-25 MG tablet      metFORMIN (GLUCOPHAGE) 500 MG tablet Take 500 mg by mouth daily.     Multiple Vitamins-Minerals (ONE-A-DAY WOMENS PO) Take 1 tablet by mouth daily with breakfast.     nebivolol  (BYSTOLIC ) 10 MG tablet Take 10 mg by mouth daily.     potassium chloride  (MICRO-K ) 10 MEQ CR capsule Take 10 mEq by mouth daily.     rosuvastatin (CRESTOR) 10 MG tablet Take 10 mg by mouth at bedtime.     No current facility-administered medications for this visit.    PHYSICAL EXAM Vitals:   06/24/24 1001  BP: 134/83  Pulse: 60  Resp: 20  Temp: 98.3 F (36.8 C)  TempSrc: Temporal  SpO2: 97%  Weight: 176 lb (79.8 kg)  Height: 5' 5 (1.651 m)   No acute distress Regular rate and rhythm Unlabored breathing No palpable pedal pulses bilaterally Feet are warm and well-perfused without ulcer  PERTINENT LABORATORY AND RADIOLOGIC DATA  Most recent CBC    Latest Ref Rng & Units 10/23/2023    5:48 AM 10/22/2023    5:34 AM 10/21/2023    4:58 AM  CBC  WBC 4.0 - 10.5 K/uL 4.6  5.0  5.0   Hemoglobin 12.0 - 15.0 g/dL 89.6  9.8  89.7   Hematocrit 36.0 - 46.0 % 32.7  31.2  31.6   Platelets 150 - 400 K/uL 192  167  169      Most recent CMP    Latest Ref Rng & Units 10/23/2023    5:48 AM 10/22/2023    5:34 AM 10/21/2023    5:01 AM  CMP  Glucose 70 - 99 mg/dL 83  82  898   BUN 8 - 23 mg/dL 11  17  18    Creatinine 0.44 - 1.00 mg/dL 9.14  8.88  8.87   Sodium 135 - 145 mmol/L 133  132  135   Potassium 3.5 - 5.1 mmol/L 3.8  3.8  4.1   Chloride 98 - 111 mmol/L 102  102  104   CO2 22 - 32 mmol/L 23  23  24    Calcium 8.9 - 10.3 mg/dL 8.8  8.5  8.6   Total Protein 6.5 - 8.1 g/dL   5.9   Total Bilirubin 0.0 - 1.2 mg/dL   0.6    Alkaline Phos 38 - 126 U/L   62   AST 15 - 41 U/L   37   ALT 0 - 44 U/L   19     Renal function CrCl cannot  be calculated (Patient's most recent lab result is older than the maximum 21 days allowed.).  Hgb A1c MFr Bld (%)  Date Value  10/19/2023 6.2 (H)    LOWER EXTREMITY DOPPLER STUDY   Patient Name:  [name]  Date of Exam:   06/24/2024  Medical Rec #: [id number]       Accession #:    [id number]  Date of Birth: [birth year]       Patient Gender: F  Patient Age:   26 years  Exam Location:  [address]  Procedure:      VAS US  ABI WITH/WO TBI  Referring Phys: DEBBY ROBERTSON    ---------------------------------------------------------------------------  -----    Indications: Leg pain since a fall April 2025 Leg pain with standing or  walking   High Risk Factors: Hypertension, no history of smoking.     Comparison Study: No prior exam   Performing Technologist: Devere Dark RVT     Examination Guidelines: A complete evaluation includes at minimum, Doppler  waveform signals and systolic blood pressure reading at the level of  bilateral  brachial, anterior tibial, and posterior tibial arteries, when vessel  segments  are accessible. Bilateral testing is considered an integral part of a  complete  examination. Photoelectric Plethysmograph (PPG) waveforms and toe systolic  pressure readings are included as required and additional duplex testing  as  needed. Limited examinations for reoccurring indications may be performed  as  noted.     ABI Findings:  +---------+------------------+-----+--------+--------+  Right   Rt Pressure (mmHg)IndexWaveformComment   +---------+------------------+-----+--------+--------+  Brachial 131                                      +---------+------------------+-----+--------+--------+  PTA     147               1.04 biphasic          +---------+------------------+-----+--------+--------+  DP      127                0.89 biphasic          +---------+------------------+-----+--------+--------+  Great Toe101               0.71 Normal            +---------+------------------+-----+--------+--------+   +---------+------------------+-----+---------+-------+  Left    Lt Pressure (mmHg)IndexWaveform Comment  +---------+------------------+-----+---------+-------+  Brachial 142                                      +---------+------------------+-----+---------+-------+  PTA     163               1.15 triphasic         +---------+------------------+-----+---------+-------+  DP      157               1.11 biphasic          +---------+------------------+-----+---------+-------+  Great Toe102               0.72 Normal            +---------+------------------+-----+---------+-------+   +-------+-----------+-----------+------------+------------+  ABI/TBIToday's ABIToday's TBIPrevious ABIPrevious TBI  +-------+-----------+-----------+------------+------------+  Right 1.04       0.71                                 +-------+-----------+-----------+------------+------------+  Left  1.15       0.72                                 +-------+-----------+-----------+------------+------------+           Summary:  Right: Resting right ankle-brachial index is within normal range. The  right toe-brachial index is normal.    Left: Resting left ankle-brachial index is within normal range. The left  toe-brachial index is normal.     *See table(s) above for measurements and observations.   Debby SAILOR. Magda, MD FACS [facility] Phone Number: [phone] 06/24/2024 11:04 AM   Total time spent on preparing this encounter including chart review, data review, collecting history, examining the patient, and coordinating care: 45 minutes Portions of this report may have been transcribed using voice recognition  software.  Every effort has been made to ensure accuracy; however, inadvertent computerized transcription errors may still be present.

## 2024-07-27 NOTE — Progress Notes (Unsigned)
°  [facility] Pharmacist Note  Name: [name]  MRN: [id number]  DOB: 1946/01/22  Sex: female PCP: Benjamine Aland, MD CPP Referral Provider: Dr. Magda  HISTORY OF PRESENT ILLNESS: [name] is a 78 y.o. female with PMH PAD with atherosclerosis of native arteries of bilateral lower extremities, HLD, HTN, T2DM, who presents for medication management for cardiovascular risk reduction.   Dyslipidemia/ASCVD  Current lipid-lowering medications: rosuvastatin  10 mg daily - tolerating well  Previously tried medications/intolerances: none  Rx affordability and access: no concerns   Current antiplatelets/antithrombotics: none  Rx affordability and access: no concerns   Past Medical History:  Diagnosis Date   Diabetes mellitus without complication (HCC)    Hyperlipidemia    Hypertension    Peripheral vascular disease    Past Surgical History:  Procedure Laterality Date   STONES REMOVED  2012   History reviewed. No pertinent family history. LABS: No results found for: CHOL, HDL, LDLCALC, LDLDIRECT, TRIG, CHOLHDL  Lab Results  Component Value Date   CREATININE 0.85 10/23/2023   BUN 11 10/23/2023   NA 133 (L) 10/23/2023   K 3.8 10/23/2023   CL 102 10/23/2023   CO2 23 10/23/2023   CrCl cannot be calculated (Patient's most recent lab result is older than the maximum 21 days allowed.).      Component Value Date/Time   PROT 5.9 (L) 10/21/2023 0501   ALBUMIN 2.4 (L) 10/21/2023 0501   AST 37 10/21/2023 0501   ALT 19 10/21/2023 0501   ALKPHOS 62 10/21/2023 0501   BILITOT 0.6 10/21/2023 0501    Lab Results  Component Value Date   HGBA1C 6.2 (H) 10/19/2023    Labs in [facility]:  03/09/24 lipid panel LDL 83 mg/dL HDL 51 mg/dL Total cholesterol 852 mg/dL Non-HDL 893 mg/dL Triglycerides 870 mg/dL  ASSESSMENT & PLAN:  Dyslipidemia LDL above goal <55 mg/dL.   Increase rosuvastatin  to 40 mg daily. Will repeat labs and can add ezetimibe if needed to reach goal at next visit.    Counseled patient on treatment, including efficacy, dosing, administration, possible adverse effects, and anticipated cost.  Reviewed long-term complications of uncontrolled cholesterol.  Reviewed goals for cholesterol readings with patient.  Reviewed dietary and lifestyle modifications to improve cholesterol.  Repeat lipid panel in 4-12 weeks.   Antiplatelets/Antithrombotics Patient with no recent revascularization.  Start aspirin  81 mg daily.   Counseled patient on treatment, including efficacy, dosing, administration, possible adverse effects, and anticipated cost.   Recommend annual influenza and COVID vaccines.   Follow up: 6-8 weeks for fasting lipid panel and follow up visit.   Lum Herald, PharmD, [place], CPP [facility] [phone]

## 2024-07-28 ENCOUNTER — Ambulatory Visit: Admitting: Student-PharmD

## 2024-07-28 ENCOUNTER — Encounter: Payer: Self-pay | Admitting: Student-PharmD

## 2024-07-28 ENCOUNTER — Other Ambulatory Visit (HOSPITAL_COMMUNITY): Payer: Self-pay

## 2024-07-28 DIAGNOSIS — I739 Peripheral vascular disease, unspecified: Secondary | ICD-10-CM

## 2024-07-28 MED ORDER — ROSUVASTATIN CALCIUM 40 MG PO TABS
40.0000 mg | ORAL_TABLET | Freq: Every day | ORAL | 3 refills | Status: AC
  Filled 2024-07-28: qty 90, 90d supply, fill #0

## 2024-07-28 MED ORDER — ASPIRIN 81 MG PO TBEC
81.0000 mg | DELAYED_RELEASE_TABLET | Freq: Every day | ORAL | 3 refills | Status: AC
  Filled 2024-07-28: qty 90, 90d supply, fill #0

## 2024-07-28 NOTE — Patient Instructions (Addendum)
 Start taking aspirin  81 mg - one tablet once daily.  Increase rosuvastatin  to 40 mg - one tablet once daily (higher dose than what you're currently taking).  Come back on Monday February 2nd first thing in the morning before you eat anything for the day to have fasting bloodwork drawn. Come to the lobby of our building. Once your labs are drawn, you can go home and eat.  The labs will tell us  if your LDL cholesterol is at the goal we want it at (less than 55). If we need to make a change, we will keep your appointment on February 5th. If everything looks good, I will call you to cancel that appointment.

## 2024-07-29 ENCOUNTER — Encounter: Payer: Self-pay | Admitting: Podiatry

## 2024-07-29 ENCOUNTER — Ambulatory Visit: Admitting: Podiatry

## 2024-07-29 DIAGNOSIS — B351 Tinea unguium: Secondary | ICD-10-CM | POA: Diagnosis not present

## 2024-07-29 DIAGNOSIS — E1165 Type 2 diabetes mellitus with hyperglycemia: Secondary | ICD-10-CM

## 2024-07-29 NOTE — Progress Notes (Signed)
 This patient returns to my office for at risk foot care.  This patient requires this care by a professional since this patient will be at risk due to having type 2 diabetes. This patient is unable to cut nails herself since the patient cannot reach her nails.These nails are painful walking and wearing shoes.  She presents to the office with her sonThis patient presents for at risk foot care today.  General Appearance  Alert, conversant and in no acute stress.  Vascular  Dorsalis pedis and posterior tibial  pulses are palpable  bilaterally.  Capillary return is within normal limits  bilaterally. Temperature is within normal limits  bilaterally.  Neurologic  Senn-Weinstein monofilament wire test within normal limits  bilaterally. Muscle power within normal limits bilaterally.  Nails Thick disfigured discolored nails with subungual debris  from hallux to fifth toes bilaterally. No evidence of bacterial infection or drainage bilaterally. Pincer nails  B/L.  Orthopedic  No limitations of motion  feet .  No crepitus or effusions noted.  No bony pathology or digital deformities noted.  Skin  normotropic skin with no porokeratosis noted bilaterally.  No signs of infections or ulcers noted.     Onychomycosis  Pain in right toes  Pain in left toes  Consent was obtained for treatment procedures.   Mechanical debridement of nails 1-5  bilaterally performed with a nail nipper.  Filed with dremel without incident.    Return office visit     3 months                 Told patient to return for periodic foot care and evaluation due to potential at risk complications.   Cordella Bold DPM

## 2024-09-17 ENCOUNTER — Ambulatory Visit: Admitting: Student-PharmD

## 2024-09-17 ENCOUNTER — Other Ambulatory Visit (HOSPITAL_COMMUNITY): Payer: Self-pay

## 2024-09-17 ENCOUNTER — Encounter: Payer: Self-pay | Admitting: Student-PharmD

## 2024-09-17 VITALS — BP 119/70 | HR 62

## 2024-09-17 DIAGNOSIS — I739 Peripheral vascular disease, unspecified: Secondary | ICD-10-CM

## 2024-09-17 MED ORDER — OMRON 3 SERIES BP MONITOR DEVI
0 refills | Status: AC
  Filled 2024-09-17: qty 1, 30d supply, fill #0

## 2024-09-17 NOTE — Progress Notes (Signed)
" °  [facility] Pharmacist Note  Name: [name]  MRN: [id number]  DOB: March 09, 1946  Sex: female PCP: Benjamine Aland, MD CPP Referral Provider: Dr. Magda  HISTORY OF PRESENT ILLNESS: [name] is a 79 y.o. female with PMH  PAD with atherosclerosis of native arteries of bilateral lower extremities, HLD, HTN, T2DM, who presents for medication management for cardiovascular risk reduction. At last PharmD visit on 07/28/24, we increased rosuvastatin  from 10 to 40 mg daily and started aspirin  81 mg daily. She is tolerating these changes well.   Dyslipidemia/ASCVD  Current lipid-lowering medications: rosuvastatin  40 mg daily  Previously tried medications/intolerances: none  Rx affordability and access: no concerns   Current antiplatelets/antithrombotics: aspirin  81 mg daily  Rx affordability and access: no concerns   Patient is up to date on annual influenza vaccine.  Patient is up to date on COVID vaccines.   Past Medical History:  Diagnosis Date   Diabetes mellitus without complication (HCC)    Hyperlipidemia    Hypertension    Peripheral vascular disease    Past Surgical History:  Procedure Laterality Date   STONES REMOVED  2012   No family history on file. LABS: No results found for: CHOL, HDL, LDLCALC, LDLDIRECT, TRIG, CHOLHDL  Lab Results  Component Value Date   CREATININE 0.85 10/23/2023   BUN 11 10/23/2023   NA 133 (L) 10/23/2023   K 3.8 10/23/2023   CL 102 10/23/2023   CO2 23 10/23/2023   CrCl cannot be calculated (Patient's most recent lab result is older than the maximum 21 days allowed.).      Component Value Date/Time   PROT 5.9 (L) 10/21/2023 0501   ALBUMIN 2.4 (L) 10/21/2023 0501   AST 37 10/21/2023 0501   ALT 19 10/21/2023 0501   ALKPHOS 62 10/21/2023 0501   BILITOT 0.6 10/21/2023 0501    Lab Results  Component Value Date   HGBA1C 6.2 (H) 10/19/2023    Labs in [facility]:  03/09/24 lipid panel LDL 83 mg/dL HDL 51 mg/dL Total cholesterol 852  mg/dL Non-HDL 893 mg/dL Triglycerides 870 mg/dL  ASSESSMENT & PLAN:  Dyslipidemia LDL above goal <55 mg/dL.   Continue rosuvastatin  40 mg daily. Will repeat lipid panel today (with direct LDL as patient is not fasting). If LDL still not at goal, will discuss adding ezetimibe.  Counseled patient on treatment, including efficacy, dosing, administration, possible adverse effects, and anticipated cost.  Reviewed long-term complications of uncontrolled cholesterol.  Reviewed goals for cholesterol readings with patient.  Reviewed dietary and lifestyle modifications to improve cholesterol.  Repeat lipid panel in 4-12 weeks after medication changes.   Antiplatelets/Antithrombotics Patient with no recent revascularization.  Continue aspirin  81 mg daily.  Counseled patient on treatment, including efficacy, dosing, administration, possible adverse effects, and anticipated cost.   Hypertension Patient also with diagnosis of HTN. In performing medication reconciliation with medication bottles patient brought in today, discovered that she is taking both indapamide and a new hydrochlorothiazide, both thiazide diuretics, and also requires a potassium supplement (both thiazides can lower potassium). She is not checking her BP at home and denies dizziness. BP in clinic today at goal <130/80 mmHg. Helped her access a BP cuff from our pharmacy today and encouraged her to bring home readings to next PCP appointment, which may help PCP determine if she truly needs both of these medications.   Follow up: phone call after labs result  Lum Herald, PharmD, BCACP, CPP [facility] [phone] "

## 2024-09-17 NOTE — Patient Instructions (Addendum)
 Continue rosuvastatin  40 mg daily. You take this for cholesterol. We are repeating your cholesterol labs today to see if your LDL is at goal which is less than 55. If not, we can add another medicine to help get us  there.   Continue taking aspirin  81 mg daily.   Check your blood pressure at home daily, and bring your readings to your PCP appointment. Ask if both indapamide and hydrochlorothiazide are needed.

## 2024-09-18 LAB — HEPATIC FUNCTION PANEL
ALT: 48 [IU]/L — ABNORMAL HIGH (ref 0–32)
AST: 38 [IU]/L (ref 0–40)
Albumin: 4.1 g/dL (ref 3.8–4.8)
Alkaline Phosphatase: 84 [IU]/L (ref 49–135)
Bilirubin Total: 0.3 mg/dL (ref 0.0–1.2)
Bilirubin, Direct: 0.11 mg/dL (ref 0.00–0.40)
Total Protein: 7.5 g/dL (ref 6.0–8.5)

## 2024-09-18 LAB — LIPID PANEL
Chol/HDL Ratio: 2.9 ratio (ref 0.0–4.4)
Cholesterol, Total: 114 mg/dL (ref 100–199)
HDL: 39 mg/dL — ABNORMAL LOW
LDL Chol Calc (NIH): 58 mg/dL (ref 0–99)
Triglycerides: 88 mg/dL (ref 0–149)
VLDL Cholesterol Cal: 17 mg/dL (ref 5–40)

## 2024-09-18 LAB — LDL CHOLESTEROL, DIRECT: LDL Direct: 59 mg/dL (ref 0–99)

## 2024-10-28 ENCOUNTER — Ambulatory Visit: Admitting: Podiatry
